# Patient Record
Sex: Female | Born: 1995 | Race: Black or African American | Hispanic: No | Marital: Single | Smoking: Never smoker
Health system: Southern US, Community
[De-identification: ages and names within clinical notes are randomized; demographics above are authoritative.]

## PROBLEM LIST (undated history)

## (undated) ENCOUNTER — Inpatient Hospital Stay: Payer: Self-pay

## (undated) ENCOUNTER — Ambulatory Visit: Admission: EM | Payer: Medicaid Other

## (undated) DIAGNOSIS — O09299 Supervision of pregnancy with other poor reproductive or obstetric history, unspecified trimester: Secondary | ICD-10-CM

## (undated) DIAGNOSIS — Z8632 Personal history of gestational diabetes: Secondary | ICD-10-CM

## (undated) DIAGNOSIS — J45909 Unspecified asthma, uncomplicated: Secondary | ICD-10-CM

## (undated) DIAGNOSIS — J302 Other seasonal allergic rhinitis: Secondary | ICD-10-CM

## (undated) DIAGNOSIS — N62 Hypertrophy of breast: Secondary | ICD-10-CM

## (undated) HISTORY — PX: NO PAST SURGERIES: SHX2092

---
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
## 2013-11-27 DIAGNOSIS — O09299 Supervision of pregnancy with other poor reproductive or obstetric history, unspecified trimester: Secondary | ICD-10-CM

## 2013-11-27 HISTORY — DX: Supervision of pregnancy with other poor reproductive or obstetric history, unspecified trimester: O09.299

## 2014-04-13 ENCOUNTER — Ambulatory Visit: Payer: Self-pay | Admitting: Advanced Practice Midwife

## 2014-04-27 ENCOUNTER — Ambulatory Visit: Payer: Self-pay | Admitting: Advanced Practice Midwife

## 2014-05-26 ENCOUNTER — Inpatient Hospital Stay: Payer: Self-pay

## 2014-05-26 LAB — CBC WITH DIFFERENTIAL/PLATELET
BASOS ABS: 0.1 10*3/uL (ref 0.0–0.1)
BASOS PCT: 0.4 %
Eosinophil #: 0.1 10*3/uL (ref 0.0–0.7)
Eosinophil %: 1.1 %
HCT: 41 % (ref 35.0–47.0)
HGB: 13.3 g/dL (ref 12.0–16.0)
LYMPHS PCT: 21.3 %
Lymphocyte #: 2.9 10*3/uL (ref 1.0–3.6)
MCH: 26.9 pg (ref 26.0–34.0)
MCHC: 32.3 g/dL (ref 32.0–36.0)
MCV: 83 fL (ref 80–100)
Monocyte #: 0.7 x10 3/mm (ref 0.2–0.9)
Monocyte %: 5.1 %
NEUTROS PCT: 72.1 %
Neutrophil #: 9.8 10*3/uL — ABNORMAL HIGH (ref 1.4–6.5)
PLATELETS: 265 10*3/uL (ref 150–440)
RBC: 4.92 10*6/uL (ref 3.80–5.20)
RDW: 15.5 % — ABNORMAL HIGH (ref 11.5–14.5)
WBC: 13.6 10*3/uL — AB (ref 3.6–11.0)

## 2014-05-26 LAB — GC/CHLAMYDIA PROBE AMP

## 2014-05-27 LAB — HEMATOCRIT: HCT: 37.9 % (ref 35.0–47.0)

## 2015-04-06 NOTE — H&P (Signed)
L&D Evaluation:  History Expanded:  HPI 19 yo G1P0 w estimated date of confinement 06/04/14 w contractions and LOF that led to hospital visit, was not initally found to have spontaneous rupture of membranes, but had large gush then at 0315.  No vaginal bleeding.  Prenatal Care at [facility], h/o O neg w Rhogam, TDaP gv, Pos GBS, h/o asthma and tx for GC this pregnancy, and RI/VI.  Plans to breast feed.  Plans OCPs.   Gravida 1   Term 0   Blood Type (Maternal) O negative   Group B Strep Results Maternal (Result >5wks must be treated as unknown) positive   Maternal HIV Negative   Maternal Syphilis Ab Reactive   Maternal Varicella Immune   Rubella Results (Maternal) immune   Maternal T-Dap Immune   [facility]   Presents with leaking fluid   Patient's Medical History Asthma   Patient's Surgical History none   Medications Pre Natal Vitamins   Allergies NKDA   Social History none   Family History Non-Contributory   ROS:  ROS All systems were reviewed.  HEENT, CNS, GI, GU, Respiratory, CV, Renal and Musculoskeletal systems were found to be normal.   Exam:  Vital Signs stable   Urine Protein negative dipstick   General no apparent distress   Mental Status clear   Chest clear   Heart normal sinus rhythm   Abdomen gravid, non-tender, gravid, tender with contractions   Estimated Fetal Weight Average for gestational age   Fetal Position Vtx   Back no CVAT   Edema no edema   Pelvic no external lesions, 3/90/-1 this am   Mebranes Ruptured   Description clear   FHT normal rate with no decels   Ucx regular   Ucx Frequency 4 min   Skin dry   Impression:  Impression active labor, w spontaneous rupture of membranes   Plan:  Plan EFM/NST, monitor contractions and for cervical change, antibiotics for GBBS prophylaxis, fluids   Comments Stadol for pain   Electronic Signatures: Letitia LibraHarris, Robert Paul (MD)  (Signed 30-Jun-15 07:12)  Authored: L&D  Evaluation   Last Updated: 30-Jun-15 07:12 by Letitia LibraHarris, Robert Paul (MD)

## 2015-11-17 ENCOUNTER — Other Ambulatory Visit: Payer: Self-pay | Admitting: Physician Assistant

## 2015-11-17 DIAGNOSIS — O0932 Supervision of pregnancy with insufficient antenatal care, second trimester: Secondary | ICD-10-CM

## 2015-11-17 LAB — OB RESULTS CONSOLE HIV ANTIBODY (ROUTINE TESTING): HIV: NONREACTIVE

## 2015-11-18 ENCOUNTER — Ambulatory Visit
Admission: RE | Admit: 2015-11-18 | Discharge: 2015-11-18 | Disposition: A | Discharge status: Home / Self Care | Payer: BLUE CROSS/BLUE SHIELD | Source: Ambulatory Visit | Attending: Physician Assistant | Admitting: Physician Assistant

## 2015-11-18 DIAGNOSIS — O0932 Supervision of pregnancy with insufficient antenatal care, second trimester: Secondary | ICD-10-CM | POA: Diagnosis present

## 2015-11-18 LAB — OB RESULTS CONSOLE RUBELLA ANTIBODY, IGM: RUBELLA: IMMUNE

## 2015-11-18 LAB — OB RESULTS CONSOLE VARICELLA ZOSTER ANTIBODY, IGG: Varicella: IMMUNE

## 2015-11-18 LAB — OB RESULTS CONSOLE RPR: RPR: NONREACTIVE

## 2015-11-18 LAB — OB RESULTS CONSOLE HEPATITIS B SURFACE ANTIGEN: Hepatitis B Surface Ag: NEGATIVE

## 2015-11-19 ENCOUNTER — Ambulatory Visit: Payer: Self-pay

## 2015-11-28 HISTORY — PX: CHOLECYSTECTOMY: SHX55

## 2015-11-28 NOTE — L&D Delivery Note (Signed)
Delivery Note At 2:33 PM a viable female was delivered via Vaginal, Spontaneous Delivery (Presentation:OA;ROA).  APGAR: 9, 9; weight  .   Placenta status: Intact,Spontaneous.  Cord:  with the following complications: None  Cord pH: NA  Called to see patient.  Mom pushed to delivery viable female infant.  The head with compound right hand followed by shoulders, which delivered without difficulty, and the rest of the body.  No nuchal cord noted.  Baby to mom's chest.  Cord clamped and cut after > 1 min delay.  Cord blood obtained.  Placenta delivered spontaneously, intact, with a 3-vessel cord.  All counts correct.  Hemostasis obtained with IV pitocin and fundal massage. EBL 200 mL.    Anesthesia: Epidural Episiotomy: None Lacerations: Right Labial scrape Suture Repair: NA Est. Blood Loss (mL): 200   Mom to postpartum.  Baby to Couplet care / Skin to Skin.  Tresea MallGLEDHILL,Loeffelholz Coldren, CNM  This patient and plan were discussed with Dr Vergie LivingPickens 03/13/2016

## 2016-01-08 IMAGING — US US OB COMP +14 WK
1 series · 13 of 28 positions shown · non-contrast
Comparison: none

ADDENDUM:
The due date is March 23, 2016, not 1193 as originally reported.
CLINICAL DATA: Limited antenatal care

EXAM:
OBSTETRICAL ULTRASOUND >14 WKS

[Series 1: us ob comp +14 wk · 0.25mm/px · 13 of 79 slices shown]
[im 3/79]
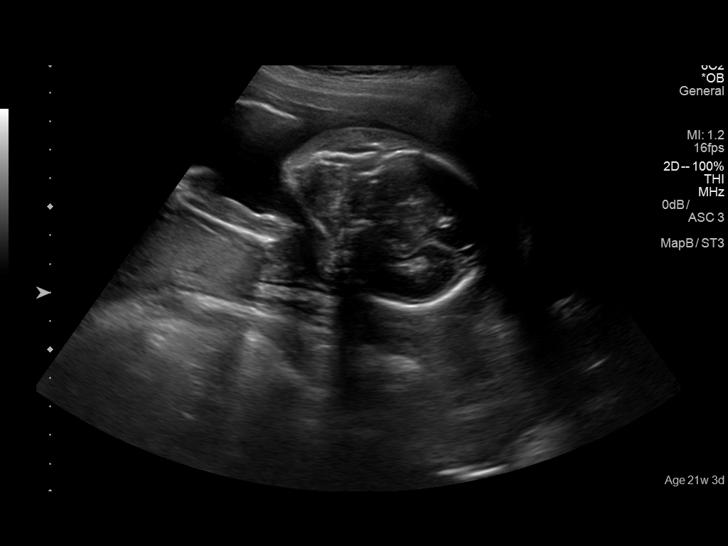
[im 9/79]
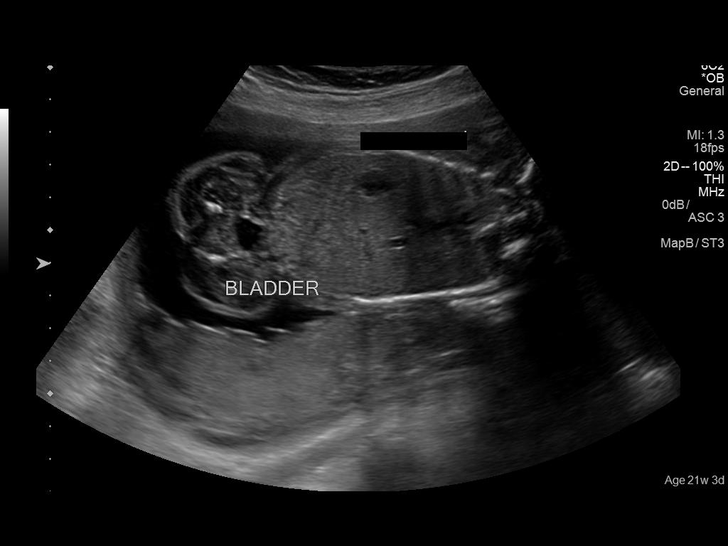
[im 15/79]
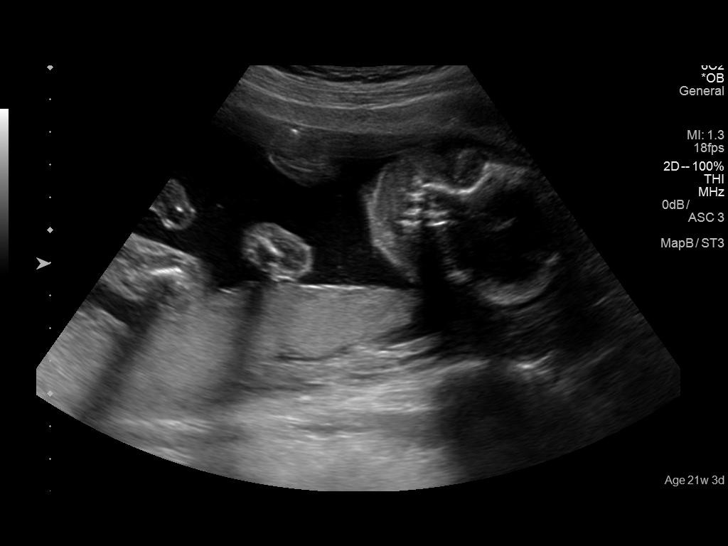
[im 21/79]
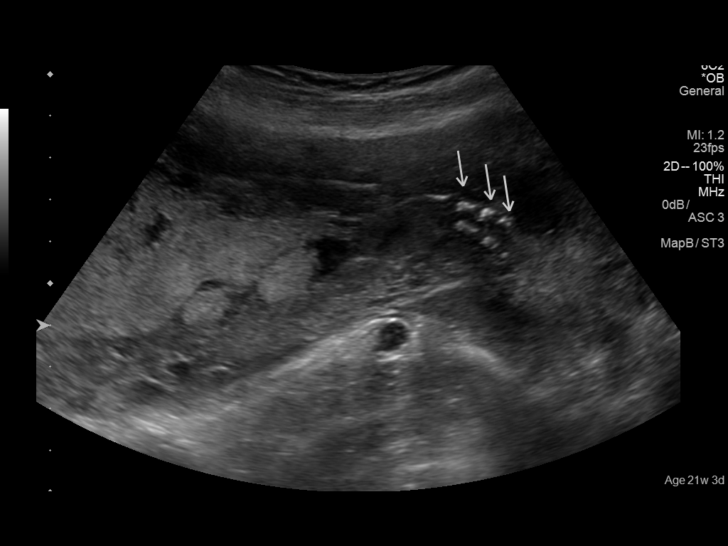
[im 27/79]
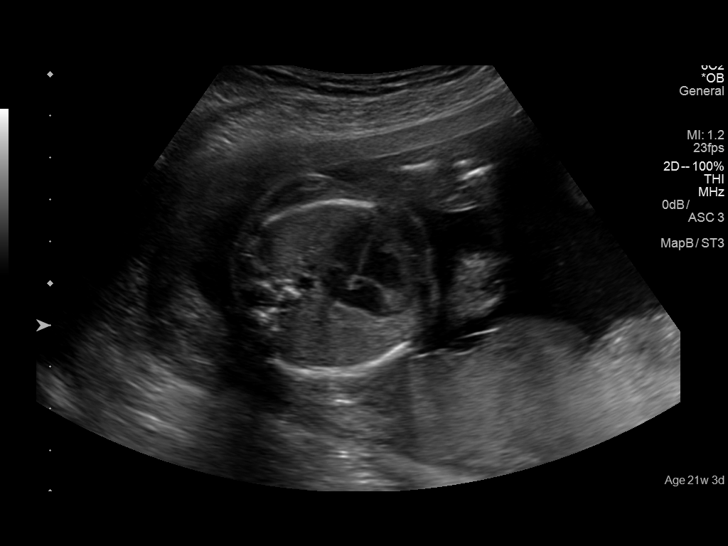
[im 32/79]
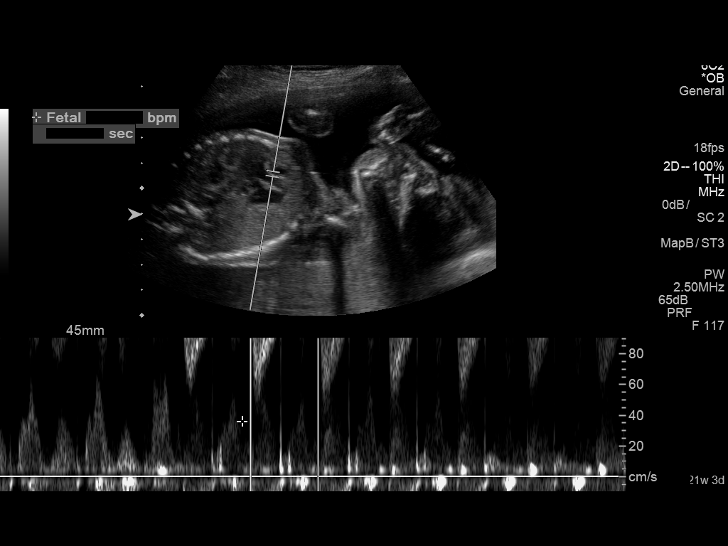
[im 41/79]
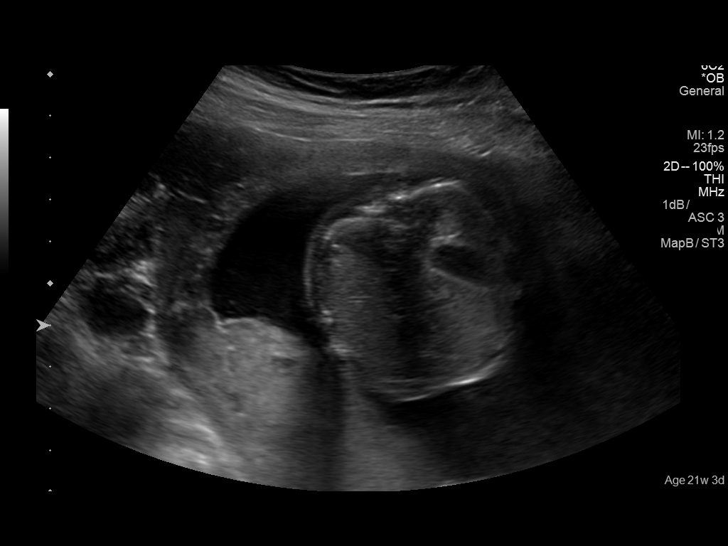
[im 47/79]
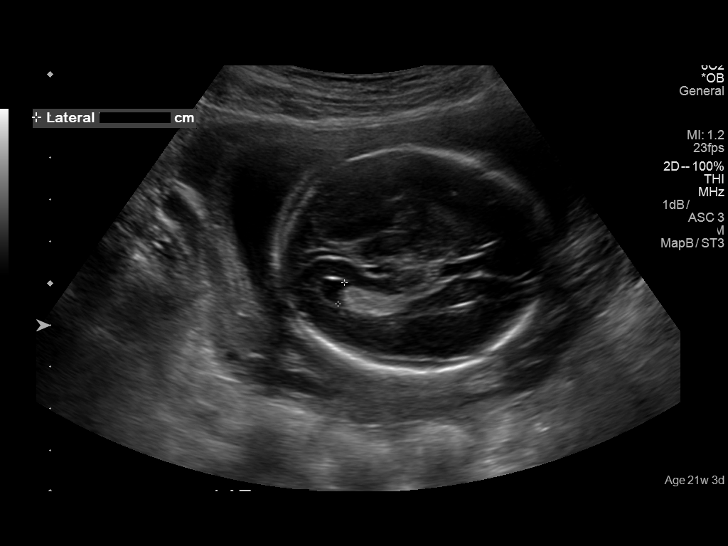
[im 53/79]
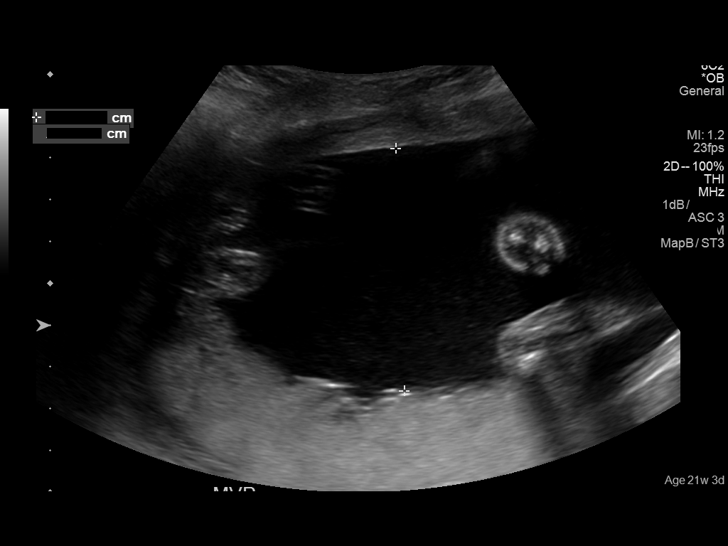
[im 58/79]
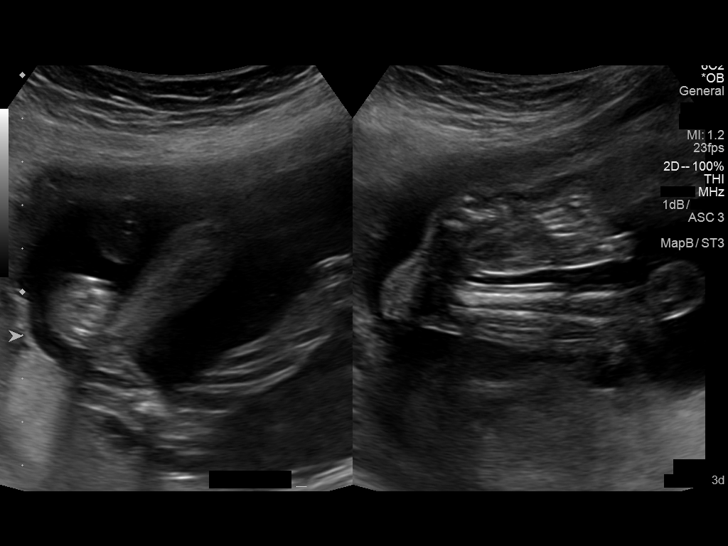
[im 64/79]
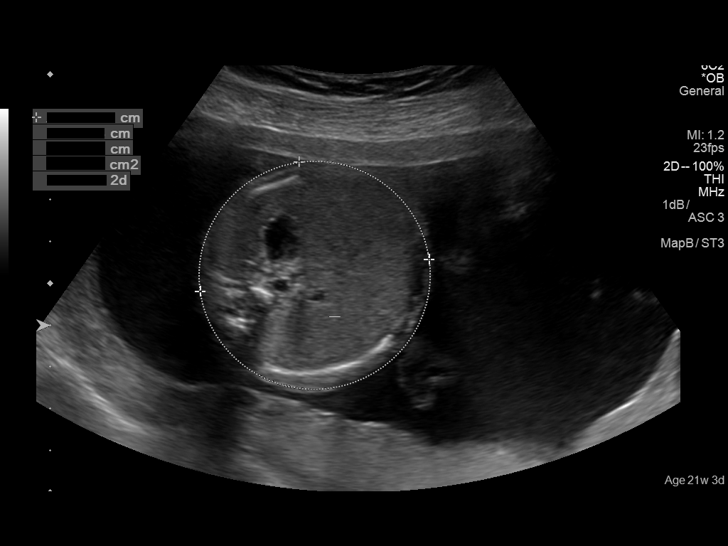
[im 70/79]
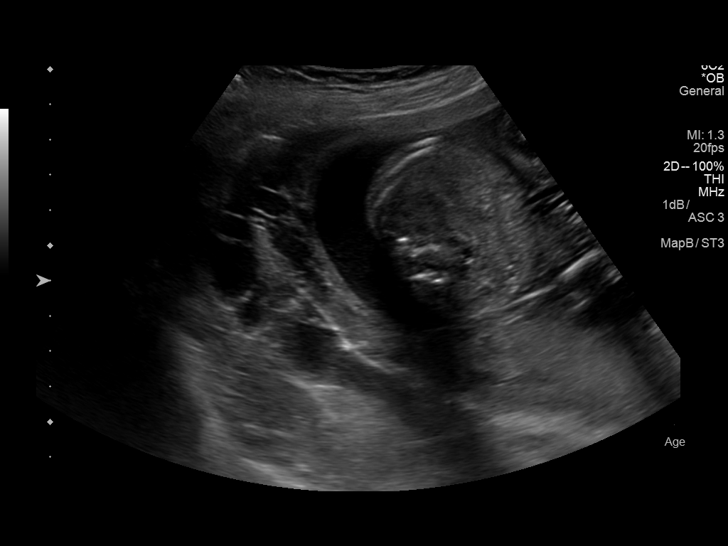
[im 76/79]
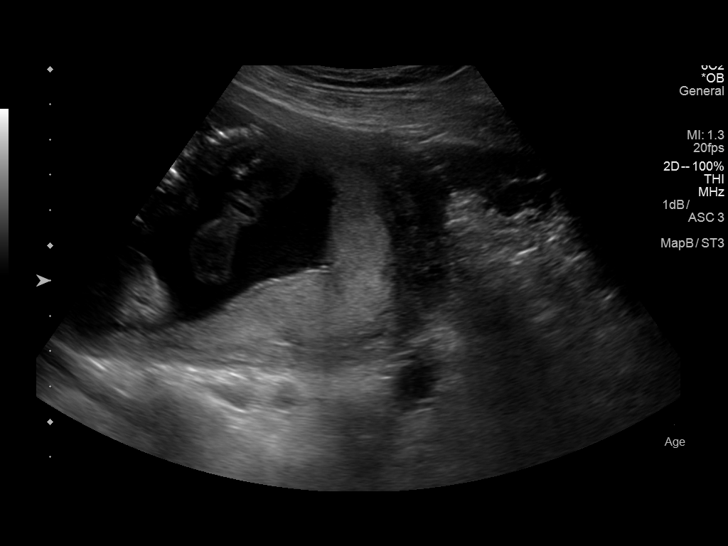

[13 of 28 positions shown; findings below may reference images not displayed]

FINDINGS: Number of Fetuses: 1

Heart Rate:  152 bpm

Movement: Present

Presentation: Cephalic

Previa: None ; the distance from the lower placental segment to the
internal cervical os is 7.6 cm

Placental Location: Posterior

Amniotic Fluid (Subjective): Normal

Vertical pocket 5.8 cmcm

FETAL BIOMETRY

BPD:  5.25cm 22w 0d

HC:    19.65cm  21w   6d

AC:   17.37cm  22w   2d

FL:   3.74cm  21w   6d

Current Mean GA: 22w 0d              US EDC: March 24, 2015

FETAL ANATOMY

Lateral Ventricles: Visualized

Thalami/CSP: The visualized

Posterior Fossa:  Visualized

Nuchal Region: Visualized    NFT= 4.5mm

Upper Lip: Visualized

Spine: Visualized

4 Chamber Heart on Left: Visualized

LVOT: Visualized

RVOT: Visualized

Stomach on Left: Visualized

3 Vessel Cord: Visualized

Cord Insertion site: Visualized

Kidneys: Visualize

Bladder: Visualize

Extremities: Visualized

Sex: Male genitalia

Maternal Findings:

Cervix: 4.6 cm with closed internal cervical os. The adnexal
structures are unremarkable.
IMPRESSION: There is a single viable IUP with estimated gestational age of 22
weeks 0 days with estimated date of confinement March 24, 2015.
This is approximately 4 days more mature than that predicted on
clinical grounds. The presentation is cephalic. The placenta is
posterior with no evidence of previa. The amniotic fluid volume is
estimated to be normal. No fetal anomalies are demonstrated.

## 2016-02-18 ENCOUNTER — Ambulatory Visit
Admission: RE | Admit: 2016-02-18 | Discharge: 2016-02-18 | Disposition: A | Discharge status: Home / Self Care | Payer: BLUE CROSS/BLUE SHIELD | Source: Ambulatory Visit | Attending: Family Medicine | Admitting: Family Medicine

## 2016-02-18 DIAGNOSIS — R000 Tachycardia, unspecified: Secondary | ICD-10-CM | POA: Diagnosis present

## 2016-02-24 ENCOUNTER — Ambulatory Visit
Admission: RE | Admit: 2016-02-24 | Discharge: 2016-02-24 | Disposition: A | Discharge status: Home / Self Care | Payer: BLUE CROSS/BLUE SHIELD | Source: Ambulatory Visit | Attending: Obstetrics and Gynecology | Admitting: Obstetrics and Gynecology

## 2016-02-24 VITALS — BP 124/74 | HR 90 | Temp 98.8°F | Resp 18 | Wt 169.4 lb

## 2016-02-24 DIAGNOSIS — O1213 Gestational proteinuria, third trimester: Secondary | ICD-10-CM

## 2016-02-24 DIAGNOSIS — O368390 Maternal care for abnormalities of the fetal heart rate or rhythm, unspecified trimester, not applicable or unspecified: Secondary | ICD-10-CM | POA: Insufficient documentation

## 2016-02-24 DIAGNOSIS — J45909 Unspecified asthma, uncomplicated: Secondary | ICD-10-CM | POA: Insufficient documentation

## 2016-02-24 HISTORY — DX: Supervision of pregnancy with other poor reproductive or obstetric history, unspecified trimester: O09.299

## 2016-02-24 HISTORY — DX: Personal history of gestational diabetes: Z86.32

## 2016-02-24 NOTE — Progress Notes (Signed)
Addendum: 24hr urine result on 02/21/15 was 147.9 mg/24hr (wnls)

## 2016-02-24 NOTE — Progress Notes (Signed)
[facility] Consultation   Chief Complaint: Proteinuria on urine dip and an episode of tachycardia during pregnancy.  HPI: Ms. Erica Glenn is a 20 y.o. G2P1001 at [redacted]w[redacted]d by L=22 week [place] (performed at [facility]) who presents in consultation from [facility] for recommendations regarding persistent 1+ proteinuria on urine dip in the absence of other symptoms (s/p treatment of a UTI this pregnancy) and an episode of tachycardia that the patient states has resolved and was just a one time event.  Past Medical History: Patient  has a h/o gestational diabetes in prior pregnancy, currently pregnant (2015). History of asthma (not since she was 20 yo). Past Surgical History: She  has no past surgical history on file.  Obstetric History:  OB History    Gravida Para Term Preterm AB TAB SAB Ectopic Multiple Living   2015 NVD at [redacted]w[redacted]d of female infant weighting 7#2oz; pregnancy complicated by diet controlled GDM  Gynecologic History:  Patient's last menstrual period was 06/21/2015.   Hx of abnormal pap smears: no   Medications: PNVs, Albuterol PRN Allergies: Patient has No Known Allergies.  Social History: Patient  reports that she has never smoked. She has never used smokeless tobacco. She reports that she does not drink alcohol or use illicit drugs. She is in a relationship with the FOB who is also the father of her daughter.  She lives with her daughter and her mother and is a full time Theatre stage manager. Family History: Father with cirrhosis and HTN, PGM with HTN, PGF with CHF (deceased), MGM with DM and breast cancer Review of Systems A full 12 point review of systems was negative or as noted in the History of Present Illness.  Physical Exam: BP 124/74 mmHg  Pulse 90  Temp(Src) 98.8 F (37.1 C) (Oral)  Resp 18  Wt 169 lb 6.4 oz (76.839 kg)  SpO2 100%  LMP 06/21/2015   Asessement: 1. Proteinuria affecting pregnancy in third trimester   2. Antepartum fetal  tachycardia affecting care of mother     Plan: 20yo G2P1001 at [redacted]w[redacted]d with 1+ proteinuria on urine dip and one episode (per patient) of tachycardia. 1.  Proteinuria.  Urinary dipstick has a high false-positive and false-negative rate when used to screen for abnormal proteinuria in pregnancy, especially at the 1+ level Erica Glenn et al., Obstetrics and Gynecology 2004). This is due to variability in urine concentration (osmolality), which can affect random urine protein concentration (ie, the dipstick result).  "False positive tests may occur in the presence of gross (macroscopic) blood in the urine, semen, very alkaline urine (pH >7), quaternary ammonium compounds, detergents and disinfectants, drugs, radio-contrast agents, and high specific gravity (>1.030). Positive tests for protein due to blood in the urine seldom exceed 1+ by dipstick. False negatives may occur with low specific gravity (<1.010), high salt concentration, highly acidic urine, or with nonalbumin proteinuria."(UpToDate Proteinuria in pregnancy).  P/C ratio was 150 which is normal; 24hr urine was reportedly collected but results are not available.  In the absence of a UTI (test of cure was reported to be negative after she was treated), hematuria, vaginal infections or elevated blood pressures and assuming the 24hr urine result is normal, 1+ proteinuria should not be further evaluated and is unlikely to be clinically significant.  If further evaluation is desired, a urinalysis to look for blood and casts should be ordered (this may have been done but records are not available).  Alternatively, a catheterized specimen may be collected but this is an invasive and uncomfortable procedure.  Suspect that the 1+ protein may, in part, be due to the difficulty in collecting a truly clean catch specimen during pregnancy.  2.  Tachycardia.  Ms. Erica Glenn denies episodes of palpitations, shortness of breath or chest pain.  She had an EKG but this has not  been interpreted yet and I don't have access to the actual read.  She reportedly had TFTs done which demonstrated a normal TSH but a low free T4 (? The actual result).  These results are not available for review.  In addition, anemia can be associated with tachycardia (although it would be persistent).  Cbc can be drawn to evaluate for anemia.  We would be more than happy to see Ms. Erica Glenn back for follow up of any of these pending labs or to review the results.  Given her lack of symptoms, I suspect that no further evaluation or intervention is indicated.  If proteinuria persists postpartum, urinalysis and referral to nephrology can be considered.   Total time spent with the patient was 30 minutes with greater than 50% spent in counseling and coordination of care. We appreciate this interesting consult and will be happy to be involved in the ongoing care of Ms. Erica Glenn in anyway her obstetricians desire.  Erica FunkSarah Anissa Abbs, MD Maternal-Fetal Medicine [facility]

## 2016-02-28 LAB — OB RESULTS CONSOLE GC/CHLAMYDIA
Chlamydia: NEGATIVE
Gonorrhea: NEGATIVE

## 2016-03-11 ENCOUNTER — Observation Stay
Admission: EM | Admit: 2016-03-11 | Discharge: 2016-03-11 | Disposition: A | Discharge status: Home / Self Care | Payer: BLUE CROSS/BLUE SHIELD | Attending: Obstetrics and Gynecology | Admitting: Obstetrics and Gynecology

## 2016-03-11 DIAGNOSIS — O1213 Gestational proteinuria, third trimester: Secondary | ICD-10-CM

## 2016-03-11 DIAGNOSIS — Z3A38 38 weeks gestation of pregnancy: Secondary | ICD-10-CM | POA: Insufficient documentation

## 2016-03-11 DIAGNOSIS — O368390 Maternal care for abnormalities of the fetal heart rate or rhythm, unspecified trimester, not applicable or unspecified: Secondary | ICD-10-CM

## 2016-03-11 DIAGNOSIS — O471 False labor at or after 37 completed weeks of gestation: Principal | ICD-10-CM | POA: Insufficient documentation

## 2016-03-11 HISTORY — DX: Unspecified asthma, uncomplicated: J45.909

## 2016-03-11 MED ORDER — MISOPROSTOL 200 MCG PO TABS
ORAL_TABLET | ORAL | Status: AC
  Filled 2016-03-11: qty 4

## 2016-03-11 MED ORDER — LIDOCAINE HCL (PF) 1 % IJ SOLN
INTRAMUSCULAR | Status: AC
  Filled 2016-03-11: qty 30

## 2016-03-11 MED ORDER — OXYTOCIN 40 UNITS IN LACTATED RINGERS INFUSION - SIMPLE MED
INTRAVENOUS | Status: AC
  Filled 2016-03-11: qty 1000

## 2016-03-11 MED ORDER — OXYTOCIN 10 UNIT/ML IJ SOLN
INTRAMUSCULAR | Status: AC
  Filled 2016-03-11: qty 2

## 2016-03-11 MED ORDER — AMMONIA AROMATIC IN INHA
RESPIRATORY_TRACT | Status: AC
  Filled 2016-03-11: qty 10

## 2016-03-11 NOTE — Discharge Instructions (Signed)
Discharge instructions reviewed with patient, patient sign copy and copy given.  Labor precautions discussed with patient, patient verbalized understanding.

## 2016-03-11 NOTE — OB Triage Note (Signed)
Patient came in with complaint of contractions that started this afternoon around 4:00. Patient rates contractions 7 out of 10 that she feels in her lower abdomen. Patient denies vaginal bleeding, but states she felt some leaking but not a gush of fluid. Patient has been feeling baby move just fine. FHR baseline 140 with moderate variability and 15 x 15 accelerations and no decelerations. Family at bedside. Will continue to monitor.

## 2016-03-11 NOTE — Discharge Summary (Signed)
Patient presented for evaluation of labor.  Patient had cervical exam by RN and this was reported to me. I reviewed her vital signs and fetal tracing, both of which were reassuring.  Patient was discharge as she was not laboring.  Thomasene MohairStephen Neiva Maenza, MD 03/11/2016 8:15 AM

## 2016-03-13 ENCOUNTER — Inpatient Hospital Stay: Payer: BLUE CROSS/BLUE SHIELD | Admitting: Anesthesiology

## 2016-03-13 ENCOUNTER — Inpatient Hospital Stay
Admission: EM | Admit: 2016-03-13 | Discharge: 2016-03-15 | DRG: 775 | Disposition: A | Discharge status: Home / Self Care | Payer: BLUE CROSS/BLUE SHIELD | Attending: Obstetrics and Gynecology | Admitting: Obstetrics and Gynecology

## 2016-03-13 DIAGNOSIS — O0933 Supervision of pregnancy with insufficient antenatal care, third trimester: Secondary | ICD-10-CM

## 2016-03-13 DIAGNOSIS — O9952 Diseases of the respiratory system complicating childbirth: Secondary | ICD-10-CM | POA: Diagnosis present

## 2016-03-13 DIAGNOSIS — Z79899 Other long term (current) drug therapy: Secondary | ICD-10-CM

## 2016-03-13 DIAGNOSIS — O1213 Gestational proteinuria, third trimester: Secondary | ICD-10-CM

## 2016-03-13 DIAGNOSIS — Z8632 Personal history of gestational diabetes: Secondary | ICD-10-CM

## 2016-03-13 DIAGNOSIS — O99824 Streptococcus B carrier state complicating childbirth: Secondary | ICD-10-CM | POA: Diagnosis present

## 2016-03-13 DIAGNOSIS — Z3A38 38 weeks gestation of pregnancy: Secondary | ICD-10-CM | POA: Diagnosis not present

## 2016-03-13 DIAGNOSIS — J45909 Unspecified asthma, uncomplicated: Secondary | ICD-10-CM | POA: Diagnosis present

## 2016-03-13 DIAGNOSIS — O1214 Gestational proteinuria, complicating childbirth: Secondary | ICD-10-CM | POA: Diagnosis present

## 2016-03-13 DIAGNOSIS — O368390 Maternal care for abnormalities of the fetal heart rate or rhythm, unspecified trimester, not applicable or unspecified: Secondary | ICD-10-CM

## 2016-03-13 LAB — ABO/RH: ABO/RH(D): O NEG

## 2016-03-13 LAB — TYPE AND SCREEN
ABO/RH(D): O NEG
Antibody Screen: NEGATIVE

## 2016-03-13 LAB — CBC
HCT: 36.1 % (ref 35.0–47.0)
Hemoglobin: 11.8 g/dL — ABNORMAL LOW (ref 12.0–16.0)
MCH: 25.4 pg — AB (ref 26.0–34.0)
MCHC: 32.8 g/dL (ref 32.0–36.0)
MCV: 77.4 fL — ABNORMAL LOW (ref 80.0–100.0)
PLATELETS: 261 10*3/uL (ref 150–440)
RBC: 4.66 MIL/uL (ref 3.80–5.20)
RDW: 16.7 % — ABNORMAL HIGH (ref 11.5–14.5)
WBC: 12.6 10*3/uL — AB (ref 3.6–11.0)

## 2016-03-13 LAB — CHLAMYDIA/NGC RT PCR (ARMC ONLY)
Chlamydia Tr: NOT DETECTED
N gonorrhoeae: NOT DETECTED

## 2016-03-13 MED ORDER — ONDANSETRON HCL 4 MG PO TABS: 4.0000 mg | ORAL_TABLET | ORAL | Status: DC | PRN

## 2016-03-13 MED ORDER — NALBUPHINE HCL 10 MG/ML IJ SOLN: 5.0000 mg | INTRAMUSCULAR | Status: DC | PRN

## 2016-03-13 MED ORDER — KETOROLAC TROMETHAMINE 30 MG/ML IJ SOLN: 30.0000 mg | Freq: Four times a day (QID) | INTRAMUSCULAR | Status: DC | PRN

## 2016-03-13 MED ORDER — OXYTOCIN 40 UNITS IN LACTATED RINGERS INFUSION - SIMPLE MED: 1.0000 m[IU]/min | INTRAVENOUS | Status: DC

## 2016-03-13 MED ORDER — EPHEDRINE 5 MG/ML INJ
5.0000 mg | INTRAVENOUS | Status: DC | PRN
  Administered 2016-03-13 (×2): 5 mg via INTRAVENOUS
  Filled 2016-03-13: qty 1

## 2016-03-13 MED ORDER — LACTATED RINGERS IV SOLN
500.0000 mL | INTRAVENOUS | Status: DC | PRN
  Administered 2016-03-13 (×2): 500 mL via INTRAVENOUS

## 2016-03-13 MED ORDER — EPHEDRINE 5 MG/ML INJ
10.0000 mg | Freq: Once | INTRAVENOUS | Status: AC
  Administered 2016-03-13: 10 mg via INTRAVENOUS

## 2016-03-13 MED ORDER — FENTANYL 2.5 MCG/ML W/ROPIVACAINE 0.2% IN NS 100 ML EPIDURAL INFUSION (ARMC-ANES): 10.0000 mL/h | EPIDURAL | Status: DC

## 2016-03-13 MED ORDER — DIBUCAINE 1 % RE OINT: 1.0000 "application " | TOPICAL_OINTMENT | RECTAL | Status: DC | PRN

## 2016-03-13 MED ORDER — ONDANSETRON HCL 4 MG/2ML IJ SOLN: 4.0000 mg | Freq: Three times a day (TID) | INTRAMUSCULAR | Status: DC | PRN

## 2016-03-13 MED ORDER — NALBUPHINE HCL 10 MG/ML IJ SOLN: 5.0000 mg | Freq: Once | INTRAMUSCULAR | Status: DC | PRN

## 2016-03-13 MED ORDER — SODIUM CHLORIDE 0.9 % IV SOLN
INTRAVENOUS | Status: AC
  Administered 2016-03-13: 2 g via INTRAVENOUS
  Filled 2016-03-13: qty 2000

## 2016-03-13 MED ORDER — LACTATED RINGERS IV SOLN
INTRAVENOUS | Status: DC
  Administered 2016-03-13: 04:00:00 via INTRAVENOUS

## 2016-03-13 MED ORDER — TETANUS-DIPHTH-ACELL PERTUSSIS 5-2.5-18.5 LF-MCG/0.5 IM SUSP: 0.5000 mL | Freq: Once | INTRAMUSCULAR | Status: DC

## 2016-03-13 MED ORDER — OXYTOCIN 40 UNITS IN LACTATED RINGERS INFUSION - SIMPLE MED
1.0000 m[IU]/min | INTRAVENOUS | Status: DC
Start: 2016-03-13 — End: 2016-03-15
  Administered 2016-03-13: 1 m[IU]/min via INTRAVENOUS

## 2016-03-13 MED ORDER — OXYTOCIN 40 UNITS IN LACTATED RINGERS INFUSION - SIMPLE MED: 2.5000 [IU]/h | INTRAVENOUS | Status: DC

## 2016-03-13 MED ORDER — IBUPROFEN 600 MG PO TABS
600.0000 mg | ORAL_TABLET | Freq: Four times a day (QID) | ORAL | Status: DC
  Administered 2016-03-13 – 2016-03-15 (×9): 600 mg via ORAL
  Filled 2016-03-13 (×9): qty 1

## 2016-03-13 MED ORDER — ACETAMINOPHEN 325 MG PO TABS: 650.0000 mg | ORAL_TABLET | ORAL | Status: DC | PRN

## 2016-03-13 MED ORDER — COCONUT OIL OIL
1.0000 "application " | TOPICAL_OIL | Status: DC | PRN
  Administered 2016-03-15: 1 via TOPICAL
  Filled 2016-03-13: qty 120

## 2016-03-13 MED ORDER — MEPERIDINE HCL 25 MG/ML IJ SOLN: 6.2500 mg | INTRAMUSCULAR | Status: DC | PRN

## 2016-03-13 MED ORDER — DIPHENHYDRAMINE HCL 50 MG/ML IJ SOLN: 12.5000 mg | INTRAMUSCULAR | Status: DC | PRN

## 2016-03-13 MED ORDER — ONDANSETRON HCL 4 MG/2ML IJ SOLN: 4.0000 mg | INTRAMUSCULAR | Status: DC | PRN

## 2016-03-13 MED ORDER — OXYTOCIN BOLUS FROM INFUSION: 500.0000 mL | INTRAVENOUS | Status: DC

## 2016-03-13 MED ORDER — SODIUM CHLORIDE 0.9% FLUSH: 3.0000 mL | INTRAVENOUS | Status: DC | PRN

## 2016-03-13 MED ORDER — DIPHENHYDRAMINE HCL 25 MG PO CAPS: 25.0000 mg | ORAL_CAPSULE | ORAL | Status: DC | PRN

## 2016-03-13 MED ORDER — SODIUM CHLORIDE FLUSH 0.9 % IV SOLN
INTRAVENOUS | Status: AC
  Filled 2016-03-13: qty 10

## 2016-03-13 MED ORDER — EPHEDRINE SULFATE 50 MG/ML IJ SOLN
INTRAMUSCULAR | Status: AC
  Filled 2016-03-13: qty 1

## 2016-03-13 MED ORDER — BENZOCAINE-MENTHOL 20-0.5 % EX AERO: 1.0000 "application " | INHALATION_SPRAY | CUTANEOUS | Status: DC | PRN

## 2016-03-13 MED ORDER — BUTORPHANOL TARTRATE 1 MG/ML IJ SOLN
2.0000 mg | INTRAMUSCULAR | Status: DC | PRN
  Administered 2016-03-13: 2 mg via INTRAVENOUS
  Filled 2016-03-13: qty 2

## 2016-03-13 MED ORDER — DIPHENHYDRAMINE HCL 25 MG PO CAPS
25.0000 mg | ORAL_CAPSULE | Freq: Four times a day (QID) | ORAL | Status: DC | PRN
Start: 2016-03-13 — End: 2016-03-15

## 2016-03-13 MED ORDER — ONDANSETRON HCL 4 MG/2ML IJ SOLN
4.0000 mg | Freq: Four times a day (QID) | INTRAMUSCULAR | Status: DC | PRN
  Administered 2016-03-13: 4 mg via INTRAVENOUS
  Filled 2016-03-13: qty 2

## 2016-03-13 MED ORDER — EPHEDRINE 5 MG/ML INJ
5.0000 mg | Freq: Once | INTRAVENOUS | Status: AC
  Administered 2016-03-13: 5 mg via INTRAVENOUS

## 2016-03-13 MED ORDER — OXYCODONE HCL 5 MG PO TABS
10.0000 mg | ORAL_TABLET | ORAL | Status: DC | PRN
Start: 2016-03-13 — End: 2016-03-15

## 2016-03-13 MED ORDER — SIMETHICONE 80 MG PO CHEW: 80.0000 mg | CHEWABLE_TABLET | ORAL | Status: DC | PRN

## 2016-03-13 MED ORDER — TERBUTALINE SULFATE 1 MG/ML IJ SOLN: 0.2500 mg | Freq: Once | INTRAMUSCULAR | Status: DC | PRN

## 2016-03-13 MED ORDER — LIDOCAINE HCL (PF) 1 % IJ SOLN: 30.0000 mL | INTRAMUSCULAR | Status: DC | PRN

## 2016-03-13 MED ORDER — WITCH HAZEL-GLYCERIN EX PADS: 1.0000 "application " | MEDICATED_PAD | CUTANEOUS | Status: DC | PRN

## 2016-03-13 MED ORDER — SODIUM CHLORIDE 0.9 % IV SOLN
INTRAVENOUS | Status: DC | PRN
  Administered 2016-03-13 (×2): 4 mL via EPIDURAL

## 2016-03-13 MED ORDER — OXYTOCIN 40 UNITS IN LACTATED RINGERS INFUSION - SIMPLE MED
INTRAVENOUS | Status: AC
  Filled 2016-03-13: qty 1000

## 2016-03-13 MED ORDER — LIDOCAINE-EPINEPHRINE (PF) 1.5 %-1:200000 IJ SOLN
INTRAMUSCULAR | Status: DC | PRN
  Administered 2016-03-13: 3 mL via EPIDURAL

## 2016-03-13 MED ORDER — NALOXONE HCL 2 MG/2ML IJ SOSY
1.0000 ug/kg/h | PREFILLED_SYRINGE | INTRAVENOUS | Status: DC | PRN
  Filled 2016-03-13: qty 2

## 2016-03-13 MED ORDER — FENTANYL 2.5 MCG/ML W/ROPIVACAINE 0.2% IN NS 100 ML EPIDURAL INFUSION (ARMC-ANES)
EPIDURAL | Status: AC
  Administered 2016-03-13: 10 mL/h via EPIDURAL
  Filled 2016-03-13: qty 100

## 2016-03-13 MED ORDER — SODIUM CHLORIDE 0.9 % IV SOLN
2.0000 g | Freq: Once | INTRAVENOUS | Status: AC
  Administered 2016-03-13: 2 g via INTRAVENOUS

## 2016-03-13 MED ORDER — SENNOSIDES-DOCUSATE SODIUM 8.6-50 MG PO TABS
2.0000 | ORAL_TABLET | ORAL | Status: DC
  Administered 2016-03-13 – 2016-03-15 (×2): 2 via ORAL
  Filled 2016-03-13 (×2): qty 2

## 2016-03-13 MED ORDER — OXYCODONE HCL 5 MG PO TABS
5.0000 mg | ORAL_TABLET | ORAL | Status: DC | PRN
  Administered 2016-03-13 – 2016-03-14 (×3): 5 mg via ORAL
  Filled 2016-03-13 (×3): qty 1

## 2016-03-13 MED ORDER — PRENATAL MULTIVITAMIN CH
1.0000 | ORAL_TABLET | Freq: Every day | ORAL | Status: DC
  Administered 2016-03-14 – 2016-03-15 (×2): 1 via ORAL
  Filled 2016-03-13 (×2): qty 1

## 2016-03-13 MED ORDER — NALOXONE HCL 0.4 MG/ML IJ SOLN: 0.4000 mg | INTRAMUSCULAR | Status: DC | PRN

## 2016-03-13 NOTE — H&P (Signed)
OB History & Physical   History of Present Illness:  Chief Complaint: IUP at 38 weeks, contractions, "water broke"  HPI:  Erica Glenn is a 20 y.o. G2P1001 female at 6380w0d dated by U/S.  Her pregnancy has been complicated by UTI, insufficient prenatal care, Rh negative, asthma.    She reports contractions.   She reports leakage of fluid.   She denies vaginal bleeding.   She reports fetal movement.    Maternal Medical History:   Past Medical History  Diagnosis Date  . H/O gestational diabetes in prior pregnancy, currently pregnant 2015    Scolosis  . Asthma     Past Surgical History  Procedure Laterality Date  . No past surgeries      No Known Allergies  Prior to Admission medications   Medication Sig Start Date End Date Taking? Authorizing Provider  Prenatal Vit-Fe Fumarate-FA (PRENATAL MULTIVITAMIN) TABS tablet Take 1 tablet by mouth daily at 12 noon.   Yes Historical Provider, MD    OB History  Gravida Para Term Preterm AB SAB TAB Ectopic Multiple Living  2 1 1       1     # Outcome Date GA Lbr Len/2nd Weight Sex Delivery Anes PTL Lv  2 Current           1 Term 05/26/14 652w5d  3.232 kg (7 lb 2 oz) F    Y      Prenatal care site: [facility]  Social History: She  reports that she has never smoked. She has never used smokeless tobacco. She reports that she does not drink alcohol or use illicit drugs.  Family History: family history is not on file.   Review of Systems: Negative x 10 systems reviewed except as noted in the HPI.    Physical Exam:  Vital Signs: BP 107/44 mmHg  Pulse 121  Temp(Src) 97.7 F (36.5 C) (Axillary)  Resp 20  Ht 5' (1.524 m)  Wt 78.019 kg (172 lb)  BMI 33.59 kg/m2  LMP 06/21/2015 General: no acute distress.  HEENT: normocephalic, atraumatic Heart: regular rate & rhythm.  No murmurs/rubs/gallops Lungs: clear to auscultation bilaterally Abdomen: soft, gravid, non-tender;  EFW: 6 1/2 to 7 pounds Pelvic:   External: Normal external  female genitalia  Cervix: Dilation: 8 / Effacement (%): 100 / Station: 0   ROM: - nitrazine Extremities: non-tender, symmetric, no edema bilaterally.  DTRs: 2+ bilaterally  Neurologic: Alert & oriented x 3.    Pertinent Results:  Prenatal Labs: Blood type/Rh O negative  Antibody screen negative  Rubella Unknown  Varicella Unknown    RPR Non Reactive  HBsAg negative  HIV negative  GC negative  Chlamydia negative  Genetic screening Not done  1 hour GTT 133  3 hour GTT NA  GBS unknown    Baseline FHR: 135 beats/min   Variability: moderate   Accelerations: present   Decelerations: absent Contractions: present frequency: 2-4 Overall assessment: Category I   Assessment:  Erica Glenn is a 20 y.o. 262P1001 female at 7980w0d with active labor.   Plan:  1. Admit to Labor & Delivery  2. CBC, T&S, Clrs, IVF 3. GBS unknown without risk factors at term: no treatment per 2010 CDC guidelines.   4. Fetal well-being: Category I 5. Epidural for pain relief as desired   Crimson Dubberly, CNM  This patient and plan were discussed with Dr Vergie LivingPickens 03/13/2016

## 2016-03-13 NOTE — Progress Notes (Signed)
Pt was seen by me at 0914 and AROM for clear fluid at that time. Decision made per prenatal records of unknown GBS status without risk factors to not treat in labor. Updated records received showing GBS bacteriuria in early pregnancy with the need to treat in labor. Order put in for ampicillin 2gram loading dose and initiated GBS prophylaxis at 1105. Also consult with Dr Vergie LivingPickens who discovered + GBS in previous pregnancy and based on the unknown status with this pregnancy and the insufficient prenatal care, recommendation to treat in labor.  Erica Glenn,Erica Glenn, CNM   This patient and plan were discussed with Dr Vergie LivingPickens 03/13/2016

## 2016-03-13 NOTE — Discharge Summary (Signed)
OB Discharge Summary  Patient Name: Erica Glenn DOB: 03-04-1996 MRN: [id number]  Date of admission: 03/13/2016 Delivering MD: Tresea Mall, CNM Date of Delivery: 03/15/2016  Date of discharge:03/15/2016  Admitting diagnosis: 38 Weeks Contractions  Intrauterine pregnancy: [redacted]w[redacted]d     Secondary diagnosis: asthma, insufficient prenatal care, persistent proteinuria- had consult with [facility], GBS carrier, UTI, Pica, Rh negative     Discharge diagnosis: Term Pregnancy Delivered                                                                                                Post partum procedures:rhogam  Augmentation: AROM and Pitocin  Complications: None  Hospital course:  Onset of Labor With Vaginal Delivery     20 y.o. yo [id number] at [redacted]w[redacted]d was admitted in Active Labor on 03/13/2016. Patient had an uncomplicated labor course as follows:  Membrane Rupture Time/Date: 9:18 AM ,03/13/2016   Intrapartum Procedures: Episiotomy:   None                                        Lacerations:   Right labial scrape Patient had a delivery of a Viable infant. 03/13/2016  Information for the patient's newborn:  Erica, Glenn [[id number]]  Delivery Method: Vaginal, Spontaneous Delivery (Filed from Delivery Summary)     Pateint had an uncomplicated postpartum course.  She is ambulating, tolerating a regular diet, passing flatus, and urinating well. Patient is discharged home in stable condition on 03/15/2016.    Physical exam  Filed Vitals:   03/14/16 1700 03/14/16 2043 03/15/16 0030 03/15/16 0758  BP:  118/71  102/64  Pulse:  93  99  Temp: 98.5 F (36.9 C) 98.2 F (36.8 C) 98 F (36.7 C) 97.4 F (36.3 C)  TempSrc: Oral Oral Oral Oral  Resp:  18  18  Height:      Weight:      SpO2:  99%  100%   General: alert, cooperative and no distress Lochia: appropriate Uterine Fundus: firm at U-2/ML/NT Incision: N/A DVT Evaluation: No evidence of DVT seen on physical exam.  Labs: Lab  Results  Component Value Date   WBC 15.4* 03/14/2016   HGB 11.3* 03/14/2016   HCT 34.4* 03/14/2016   MCV 78.7* 03/14/2016   PLT 246 03/14/2016  O neg/ RI/ VI/ TDAP UTD   Discharge instruction: per After Visit Summary.  Medications:    Medication List    TAKE these medications        ibuprofen 600 MG tablet  Commonly known as:  ADVIL,MOTRIN  Take 1 tablet (600 mg total) by mouth every 6 (six) hours.     oxyCODONE-acetaminophen 5-325 MG tablet  Commonly known as:  PERCOCET/ROXICET  Take 1 tablet by mouth every 6 (six) hours as needed for moderate pain or severe pain.     prenatal multivitamin Tabs tablet  Take 1 tablet by mouth daily at 12 noon.        Diet: routine diet  Activity: Advance as tolerated. Pelvic rest  for 6 weeks.   Outpatient follow up: Follow up at [facility] in 6 weeks  Postpartum contraception: undecided, discussed options while breast feeding Rhogam Given postpartum: yes, baby O POS Rubella vaccine given postpartum: no Varicella vaccine given postpartum: no TDaP given antepartum or postpartum: TDaP given 07/02/15 Flu vaccine given antepartum or postpartum: yes  Newborn Data: Live born female Erica Glenn Weight:  7#13.9oz APGAR: 9, 9   Baby Feeding: Breast  Disposition:home with mother  Farrel ConnersGUTIERREZ, Nicholette Dolson, CNM

## 2016-03-13 NOTE — Anesthesia Procedure Notes (Signed)
Epidural Patient location during procedure: OB Start time: 03/13/2016 5:54 AM End time: 03/13/2016 6:20 AM  Staffing Anesthesiologist: Lenard SimmerKARENZ, Tesneem Dufrane Performed by: anesthesiologist   Preanesthetic Checklist Completed: patient identified, site marked, surgical consent, pre-op evaluation, timeout performed, IV checked, risks and benefits discussed and monitors and equipment checked  Epidural Patient position: sitting Prep: ChloraPrep Patient monitoring: heart rate, continuous pulse ox and blood pressure Approach: midline Location: L4-L5 Injection technique: LOR saline  Needle:  Needle type: Tuohy  Needle gauge: 17 G Needle length: 9 cm and 9 Needle insertion depth: 5 cm Catheter type: closed end flexible Catheter size: 19 Gauge Catheter at skin depth: 9.5 cm Test dose: negative and 1.5% lidocaine with Epi 1:200 K  Assessment Events: blood not aspirated, injection not painful, no injection resistance, negative IV test and no paresthesia  Additional Notes   Patient tolerated the insertion well without complications.Reason for block:procedure for pain

## 2016-03-13 NOTE — OB Triage Note (Signed)
Pt arrived from ED with c/o SROM at home around 0100 and contractions. Pt placed on monitors and oriented to room.

## 2016-03-13 NOTE — Anesthesia Preprocedure Evaluation (Signed)
Anesthesia Evaluation  Patient identified by MRN, date of birth, ID band Patient awake    Reviewed: Allergy & Precautions, H&P , NPO status , Patient's Chart, lab work & pertinent test results, reviewed documented beta blocker date and time   History of Anesthesia Complications Negative for: history of anesthetic complications  Airway Mallampati: III  TM Distance: >3 FB Neck ROM: full    Dental no notable dental hx. (+) Teeth Intact   Pulmonary neg shortness of breath, asthma , neg sleep apnea, neg recent URI,    Pulmonary exam normal breath sounds clear to auscultation       Cardiovascular Exercise Tolerance: Good negative cardio ROS Normal cardiovascular exam Rhythm:regular Rate:Normal     Neuro/Psych negative neurological ROS  negative psych ROS   GI/Hepatic negative GI ROS, Neg liver ROS,   Endo/Other  negative endocrine ROS  Renal/GU negative Renal ROS  negative genitourinary   Musculoskeletal   Abdominal   Peds  Hematology negative hematology ROS (+)   Anesthesia Other Findings Past Medical History:   H/O gestational diabetes in prior pregnancy, c* 2015           Comment:Scolosis   Asthma                                                       Reproductive/Obstetrics negative OB ROS                             Anesthesia Physical Anesthesia Plan  ASA: II  Anesthesia Plan: Epidural   Post-op Pain Management:    Induction:   Airway Management Planned:   Additional Equipment:   Intra-op Plan:   Post-operative Plan:   Informed Consent: I have reviewed the patients History and Physical, chart, labs and discussed the procedure including the risks, benefits and alternatives for the proposed anesthesia with the patient or authorized representative who has indicated his/her understanding and acceptance.   Dental Advisory Given  Plan Discussed with: Anesthesiologist, CRNA  and Surgeon  Anesthesia Plan Comments:         Anesthesia Quick Evaluation

## 2016-03-14 LAB — CBC
HCT: 34.4 % — ABNORMAL LOW (ref 35.0–47.0)
Hemoglobin: 11.3 g/dL — ABNORMAL LOW (ref 12.0–16.0)
MCH: 25.7 pg — ABNORMAL LOW (ref 26.0–34.0)
MCHC: 32.7 g/dL (ref 32.0–36.0)
MCV: 78.7 fL — ABNORMAL LOW (ref 80.0–100.0)
PLATELETS: 246 10*3/uL (ref 150–440)
RBC: 4.38 MIL/uL (ref 3.80–5.20)
RDW: 16.7 % — ABNORMAL HIGH (ref 11.5–14.5)
WBC: 15.4 10*3/uL — AB (ref 3.6–11.0)

## 2016-03-14 LAB — RPR: RPR Ser Ql: NONREACTIVE

## 2016-03-14 LAB — FETAL SCREEN: Fetal Screen: NEGATIVE

## 2016-03-14 MED ORDER — RHO D IMMUNE GLOBULIN 1500 UNIT/2ML IJ SOSY
300.0000 ug | PREFILLED_SYRINGE | Freq: Once | INTRAMUSCULAR | Status: AC
  Administered 2016-03-14: 300 ug via INTRAMUSCULAR
  Filled 2016-03-14: qty 2

## 2016-03-14 NOTE — Progress Notes (Signed)
I have reviewed all charting done by Andrea Cox (student RN).Erica Lehn RN 

## 2016-03-14 NOTE — Progress Notes (Signed)
Post Partum Day 1 Subjective: Voiding without difficulty, tolerating regualr diet, cramping can be intense, breastfeeding  Objective: Blood pressure 119/85, pulse 87, temperature 97.8 F (36.6 C), temperature source Oral, resp. rate 18, height 5' (1.524 m), weight 78.019 kg (172 lb), last menstrual period 06/21/2015, SpO2 100 %, unknown if currently breastfeeding.  Physical Exam:  General: alert, cooperative and no distress Lochia: appropriate Uterine Fundus: firm/ at U/ ML/ NT DVT Evaluation: No evidence of DVT seen on physical exam.   Recent Labs  03/13/16 0416 03/14/16 0553  HGB 11.8* 11.3*  HCT 36.1 34.4*  WBC 12.6* 15.4*  PLT 261 246    Assessment/Plan: Stable ppd #1 Continue postpartum care Probable discharge tomorrow Breast O neg and baby O pos-received Rhogam today Vi/ RI TDAP UTD   LOS: 1 day   Lydia Toren 03/14/2016, 12:42 PM

## 2016-03-14 NOTE — Anesthesia Postprocedure Evaluation (Signed)
Anesthesia Post Note  Patient: Erica Glenn  Procedure(s) Performed: * No procedures listed *  Patient location during evaluation: Mother Baby Anesthesia Type: Epidural Level of consciousness: awake and alert Pain management: pain level controlled Vital Signs Assessment: post-procedure vital signs reviewed and stable Respiratory status: spontaneous breathing, nonlabored ventilation and respiratory function stable Cardiovascular status: stable Postop Assessment: no headache, no backache and epidural receding Anesthetic complications: no    Last Vitals:  Filed Vitals:   03/14/16 0000 03/14/16 0430  BP: 113/64 115/63  Pulse: 106 95  Temp: 36.7 C 36.8 C  Resp: 18 16    Last Pain:  Filed Vitals:   03/14/16 0500  PainSc: 3                  [name],  [name] A

## 2016-03-15 LAB — RHOGAM INJECTION: Unit division: 0

## 2016-03-15 MED ORDER — IBUPROFEN 600 MG PO TABS: 600.0000 mg | ORAL_TABLET | Freq: Four times a day (QID) | ORAL | Status: DC

## 2016-03-15 MED ORDER — OXYCODONE-ACETAMINOPHEN 5-325 MG PO TABS: 1.0000 | ORAL_TABLET | Freq: Four times a day (QID) | ORAL | Status: DC | PRN

## 2016-03-15 NOTE — Discharge Instructions (Signed)
Vaginal Delivery, Care After Refer to this sheet in the next few weeks. These discharge instructions provide you with information on caring for yourself after delivery. Your caregiver may also give you specific instructions. Your treatment has been planned according to the most current medical practices available, but problems sometimes occur. Call your caregiver if you have any problems or questions after you go home. HOME CARE INSTRUCTIONS 1. Take over-the-counter or prescription medicines only as directed by your caregiver or pharmacist. 2. Do not drink alcohol, especially if you are breastfeeding or taking medicine to relieve pain. 3. Do not smoke tobacco. 4. Continue to use good perineal care. Good perineal care includes: 1. Wiping your perineum from back to front 2. Keeping your perineum clean. 3. You can do sitz baths twice a day, to help keep this area clean 5. Do not use tampons, douche or have sex for 6 weeks postpartum 6. Shower only and avoid sitting in submerged water, aside from sitz baths 7. Wear a well-fitting bra that provides breast support. 8. Eat healthy foods. 9. Drink enough fluids to keep your urine clear or pale yellow. 10. Eat high-fiber foods such as whole grain cereals and breads, brown rice, beans, and fresh fruits and vegetables every day. These foods may help prevent or relieve constipation. 11. Avoid constipation with high fiber foods or medications, such as miralax or metamucil 12. Follow your caregiver's recommendations regarding resumption of activities such as climbing stairs, driving, lifting, exercising, or traveling. 13. You can resume sexual activities in 6 weeks. Resumption of sexual activities is dependent upon  your rate of healing, and your comfort and desire to resume sexual activity.  14. Try to have someone help you with your household activities and your newborn for at least a few days after you leave the hospital. 15. Rest as much as possible.  Try to rest or take a nap when your newborn is sleeping. 16. Increase your activities gradually. 17. Keep all of your scheduled postpartum appointments. It is very important to keep your scheduled follow-up appointments. At these appointments, your caregiver will be checking to make sure that you are healing physically and emotionally. SEEK MEDICAL CARE IF:   You are passing large clots from your vagina. Save any clots to show your caregiver.  You have a foul smelling discharge from your vagina.  You have trouble urinating.  You are urinating frequently.  You have pain when you urinate.  You have a change in your bowel movements.  You have increasing redness, pain, or swelling near your vaginal incision (episiotomy) or vaginal tear.  You have pus draining from your episiotomy or vaginal tear.  Your episiotomy or vaginal tear is separating.  You have painful, hard, or reddened breasts.  You have a severe headache.  You have blurred vision or see spots.  You feel sad or depressed.  You have thoughts of hurting yourself or your newborn.  You have questions about your care, the care of your newborn, or medicines.  You are dizzy or light-headed.  You have a rash.  You have nausea or vomiting.  You were breastfeeding and have not had a menstrual period within 12 weeks after you stopped breastfeeding.  You are not breastfeeding and have not had a menstrual period by the 12th week after delivery.  You have a fever. SEEK IMMEDIATE MEDICAL CARE IF:   You have persistent pain.  You have chest pain.  You have shortness of breath.  You faint.  You  have leg pain.  You have stomach pain.  Your vaginal bleeding saturates two or more sanitary pads in 1 hour. MAKE SURE YOU:   Understand these instructions.  Will watch your condition.  Will get help right away if you are not doing well or get worse. Document Released: 11/10/2000 Document Revised: 03/30/2014 Document  Reviewed: 07/10/2012 [facility] Patient Information 2015 [place], [place]. This information is not intended to replace advice given to you by your health care provider. Make sure you discuss any questions you have with your health care provider.  Sitz Bath A sitz bath is a warm water bath taken in the sitting position. The water covers only the hips and butt (buttocks). We recommend using one that fits in the toilet, to help with ease of use and cleanliness. It may be used for either healing or cleaning purposes. Sitz baths are also used to relieve pain, itching, or muscle tightening (spasms). The water may contain medicine. Moist heat will help you heal and relax.  HOME CARE  Take 3 to 4 sitz baths a day. 18. Fill the bathtub half-full with warm water. 19. Sit in the water and open the drain a little. 20. Turn on the warm water to keep the tub half-full. Keep the water running constantly. 21. Soak in the water for 15 to 20 minutes. 22. After the sitz bath, pat the affected area dry. GET HELP RIGHT AWAY IF: You get worse instead of better. Stop the sitz baths if you get worse. MAKE SURE YOU:  Understand these instructions.  Will watch your condition.  Will get help right away if you are not doing well or get worse. Document Released: 12/21/2004 Document Revised: 08/07/2012 Document Reviewed: 03/13/2011 [facility] Patient Information 2015 [place], [place]. This information is not intended to replace advice given to you by your health care provider. Make sure you discuss any questions you have with your health care provider.  Discharged home with infant via w/c

## 2016-03-15 NOTE — Progress Notes (Signed)
I have verified all charting done by Andrea Cox (student RN). Nashton Belson RN 

## 2016-03-22 ENCOUNTER — Ambulatory Visit
Admission: RE | Admit: 2016-03-22 | Discharge: 2016-03-22 | Disposition: A | Discharge status: Home / Self Care | Payer: BLUE CROSS/BLUE SHIELD | Source: Ambulatory Visit | Attending: Internal Medicine | Admitting: Internal Medicine

## 2016-03-22 NOTE — Lactation Note (Signed)
Lactation Consultation Note  Patient Name: Vicenta DunningKianie S Scala WUJWJ'XToday's Date: 03/22/2016     Maternal Data  Mother was referred by baby's pediatrician for slow weight gain due to his inability to stay latched on during feedings. Mom was told to supplement with 25mL of formula if baby did not latch for feedings and to hand express or pump in order to maintain milk supply until she could see LC.  Feeding  "AJ" was able to successfully feed for 25mins with lots of swallows with the help of a 20mm nipple shield.  Baby took 3oz for mom's left breast during visit and was too full to feed from right side.   LATCH Score/Interventions  Mom needs to use 20mm nipple shield during every feeding. 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 Mom feels very comfortable with breastfeeding now. This is her second time breastfeeding. She has a 1520 month old who she breastfed for 6mos. Mom was encouraged nurse for the next 24hrs exclusively and only to supplement if baby did not get lots of audible swallow and/or does not appear full. Mom was also encouraged to join breastfeeding support groups.                     Lactation Tools Discussed/Used     Consult Status      Burnadette PeterJaniya M Apollo Timothy 03/22/2016, 1:04 PM

## 2016-04-23 ENCOUNTER — Emergency Department
Admission: EM | Admit: 2016-04-23 | Discharge: 2016-04-23 | Disposition: A | Discharge status: Home / Self Care | Payer: BLUE CROSS/BLUE SHIELD | Attending: Emergency Medicine | Admitting: Emergency Medicine

## 2016-04-23 ENCOUNTER — Encounter: Payer: Self-pay | Admitting: Emergency Medicine

## 2016-04-23 DIAGNOSIS — J45909 Unspecified asthma, uncomplicated: Secondary | ICD-10-CM | POA: Diagnosis not present

## 2016-04-23 DIAGNOSIS — H00022 Hordeolum internum right lower eyelid: Secondary | ICD-10-CM | POA: Diagnosis present

## 2016-04-23 DIAGNOSIS — H00032 Abscess of right lower eyelid: Secondary | ICD-10-CM | POA: Insufficient documentation

## 2016-04-23 DIAGNOSIS — H00033 Abscess of eyelid right eye, unspecified eyelid: Secondary | ICD-10-CM

## 2016-04-23 MED ORDER — CEPHALEXIN 500 MG PO CAPS
ORAL_CAPSULE | ORAL | Status: AC
  Administered 2016-04-23: 500 mg via ORAL
  Filled 2016-04-23: qty 1

## 2016-04-23 MED ORDER — CEPHALEXIN 500 MG PO CAPS: 500.0000 mg | ORAL_CAPSULE | Freq: Two times a day (BID) | ORAL | Status: AC

## 2016-04-23 MED ORDER — CEPHALEXIN 500 MG PO CAPS
500.0000 mg | ORAL_CAPSULE | Freq: Once | ORAL | Status: AC
  Administered 2016-04-23: 500 mg via ORAL

## 2016-04-23 NOTE — ED Notes (Addendum)
Pt to ED via POV with c/o left eye stye x 1week ago, pt states two new pus pockets formed yesterday with green drainage. Pt denies blurred vision or fever

## 2016-04-23 NOTE — ED Provider Notes (Signed)
[facility] Emergency Department Provider Note ____________________________________________  Time seen: Approximately 8:33 PM  I have reviewed the triage vital signs and the nursing notes.   HISTORY  Chief Complaint Stye   HPI Erica Glenn is a 20 y.o. female who presents to the emergency department for evaluation of infection of the right lower eyelid. She states that she had a stye in the upper lid about a week ago, but yesterday she had 2 areas forearm on the lower lid that are very tender and draining. She states that over the last 24 hours she has noticed some swelling in her lower lid. She has not used any ointment or medication in the eye.  Past Medical History  Diagnosis Date  . H/O gestational diabetes in prior pregnancy, currently pregnant 2015    Scolosis  . Asthma     Patient Active Problem List   Diagnosis Date Noted  . Postpartum care following vaginal delivery 03/11/2016  . Proteinuria affecting pregnancy in third trimester 02/24/2016  . Asthma 02/24/2016    Past Surgical History  Procedure Laterality Date  . No past surgeries      Current Outpatient Rx  Name  Route  Sig  Dispense  Refill  . cephALEXin (KEFLEX) 500 MG capsule   Oral   Take 1 capsule (500 mg total) by mouth 2 (two) times daily.   20 capsule   0   . ibuprofen (ADVIL,MOTRIN) 600 MG tablet   Oral   Take 1 tablet (600 mg total) by mouth every 6 (six) hours.   30 tablet   0   . oxyCODONE-acetaminophen (PERCOCET/ROXICET) 5-325 MG tablet   Oral   Take 1 tablet by mouth every 6 (six) hours as needed for moderate pain or severe pain.   12 tablet   0   . Prenatal Vit-Fe Fumarate-FA (PRENATAL MULTIVITAMIN) TABS tablet   Oral   Take 1 tablet by mouth daily at 12 noon.           Allergies Review of patient's allergies indicates no known allergies.  No family history on file.  Social History Social History  Substance Use Topics  . Smoking status: Never  Smoker   . Smokeless tobacco: Never Used  . Alcohol Use: No    Review of Systems   Constitutional: No fever/chills Eyes: Negative for visual changes. Negative for pain. Positive for yellow drainage of the right lower eyelid. Musculoskeletal: Negative for pain. Skin: Negative for rash. Neurological: Negative for headaches, focal weakness or numbness. Allergic: Negative for seasonal allergies. ____________________________________________  PHYSICAL EXAM:  VITAL SIGNS: ED Triage Vitals  Enc Vitals Group     BP 04/23/16 2026 118/82 mmHg     Pulse Rate 04/23/16 2026 72     Resp 04/23/16 2026 16     Temp 04/23/16 2026 98.5 F (36.9 C)     Temp Source 04/23/16 2026 Oral     SpO2 04/23/16 2026 98 %     Weight --      Height --      Head Cir --      Peak Flow --      Pain Score 04/23/16 2022 6     Pain Loc --      Pain Edu? --      Excl. in GC? --     Constitutional: Alert and oriented. Well appearing and in no acute distress. Eyes: Visual acuity--see nursing documentation; No globe trauma; Eyelids--right lower lid with purulent drainage noted  consistent with large stye that has formed a small abscess which has now caused the lower lid to become erythematous and edematous; left eyelids unaffected; Sclera appears normal.  Eyelids were inverted. Conjunctiva appears normal; Cornea normal on visual exam. Head: Atraumatic. Nose: No congestion/rhinnorhea. Mouth/Throat: Mucous membranes are moist.  Oropharynx non-erythematous. Respiratory: Respirations even and unlabored Musculoskeletal:Normal ROM x 4 extremities. Neurologic:  Normal speech and language. No gross focal neurologic deficits are appreciated. Speech is normal. No gait instability. Skin:  Skin is warm, dry and intact. No rash noted. Psychiatric: Mood and affect are normal. Speech and behavior are normal.  ____________________________________________   LABS (all labs ordered are listed, but only abnormal results are  displayed)  Labs Reviewed - No data to display ____________________________________________  EKG   ____________________________________________  RADIOLOGY   ____________________________________________   PROCEDURES  Procedure(s) performed: None  ____________________________________________   INITIAL IMPRESSION / ASSESSMENT AND PLAN / ED COURSE  Pertinent labs & imaging results that were available during my care of the patient were reviewed by me and considered in my medical decision making (see chart for details).  Patient to receive prescriptions for Keflex.  She was advised to follow up with ophthalmology for symptoms that are not improving over the next 2-3 days. She was  also advised to return to the ER for symptoms that change or worsen if unable to schedule an appointment.  ____________________________________________   FINAL CLINICAL IMPRESSION(S) / ED DIAGNOSES  Final diagnoses:  Eyelid abscess, right    Note:  This document was prepared using Dragon voice recognition software and may include unintentional dictation errors.    Chinita Pester, FNP 04/23/16 2133  Jeanmarie Plant, MD 04/23/16 2209

## 2016-07-12 ENCOUNTER — Emergency Department
Admission: EM | Admit: 2016-07-12 | Discharge: 2016-07-12 | Disposition: A | Discharge status: Home / Self Care | Payer: Medicaid Other | Attending: Emergency Medicine | Admitting: Emergency Medicine

## 2016-07-12 ENCOUNTER — Encounter: Payer: Self-pay | Admitting: *Deleted

## 2016-07-12 DIAGNOSIS — J45909 Unspecified asthma, uncomplicated: Secondary | ICD-10-CM | POA: Insufficient documentation

## 2016-07-12 DIAGNOSIS — T814XXA Infection following a procedure, initial encounter: Secondary | ICD-10-CM | POA: Diagnosis present

## 2016-07-12 DIAGNOSIS — L03311 Cellulitis of abdominal wall: Secondary | ICD-10-CM | POA: Insufficient documentation

## 2016-07-12 DIAGNOSIS — Y818 Miscellaneous general- and plastic-surgery devices associated with adverse incidents, not elsewhere classified: Secondary | ICD-10-CM | POA: Diagnosis not present

## 2016-07-12 DIAGNOSIS — T8140XA Infection following a procedure, unspecified, initial encounter: Secondary | ICD-10-CM

## 2016-07-12 LAB — CBC WITH DIFFERENTIAL/PLATELET
BASOS ABS: 0.1 10*3/uL (ref 0–0.1)
Basophils Relative: 1 %
EOS ABS: 1 10*3/uL — AB (ref 0–0.7)
EOS PCT: 8 %
HCT: 40.4 % (ref 35.0–47.0)
HEMOGLOBIN: 13 g/dL (ref 12.0–16.0)
LYMPHS ABS: 4.6 10*3/uL — AB (ref 1.0–3.6)
Lymphocytes Relative: 33 %
MCH: 26.5 pg (ref 26.0–34.0)
MCHC: 32.3 g/dL (ref 32.0–36.0)
MCV: 82.2 fL (ref 80.0–100.0)
Monocytes Absolute: 0.5 10*3/uL (ref 0.2–0.9)
Monocytes Relative: 4 %
Neutro Abs: 7.5 10*3/uL — ABNORMAL HIGH (ref 1.4–6.5)
Neutrophils Relative %: 54 %
PLATELETS: 322 10*3/uL (ref 150–440)
RBC: 4.91 MIL/uL (ref 3.80–5.20)
RDW: 14.4 % (ref 11.5–14.5)
WBC: 13.8 10*3/uL — AB (ref 3.6–11.0)

## 2016-07-12 LAB — COMPREHENSIVE METABOLIC PANEL
ALT: 35 U/L (ref 14–54)
AST: 26 U/L (ref 15–41)
Albumin: 4.3 g/dL (ref 3.5–5.0)
Alkaline Phosphatase: 116 U/L (ref 38–126)
Anion gap: 5 (ref 5–15)
BILIRUBIN TOTAL: 0.5 mg/dL (ref 0.3–1.2)
BUN: 12 mg/dL (ref 6–20)
CHLORIDE: 108 mmol/L (ref 101–111)
CO2: 26 mmol/L (ref 22–32)
CREATININE: 0.61 mg/dL (ref 0.44–1.00)
Calcium: 8.8 mg/dL — ABNORMAL LOW (ref 8.9–10.3)
Glucose, Bld: 93 mg/dL (ref 65–99)
Potassium: 3.2 mmol/L — ABNORMAL LOW (ref 3.5–5.1)
Sodium: 139 mmol/L (ref 135–145)
TOTAL PROTEIN: 7.6 g/dL (ref 6.5–8.1)

## 2016-07-12 MED ORDER — CEPHALEXIN 500 MG PO CAPS: 500.0000 mg | ORAL_CAPSULE | Freq: Three times a day (TID) | ORAL | 0 refills | Status: DC

## 2016-07-12 NOTE — ED Provider Notes (Signed)
[facility] Emergency Department Provider Note  ____________________________________________  Time seen: Approximately 7:45 PM  I have reviewed the triage vital signs and the nursing notes.   HISTORY  Chief Complaint Post-op Problem    HPI Erica Glenn is a 20 y.o. female who complains of pain and redness on the right side of her abdomen at the site of her recent lap or septic surgery. She had laparoscopic cholecystectomy done a few weeks ago. Everything is been going fine, no nausea or vomiting. Eating and drinking normally. Normal bowel movements. Pain is controlled. However over the last 2 days she's had some pain and redness developing over the slap or septic area on the right abdominal wall. No purulent drainage. Otherwise feeling okay.     Past Medical History:  Diagnosis Date  . Asthma   . H/O gestational diabetes in prior pregnancy, currently pregnant 2015   Scolosis     Patient Active Problem List   Diagnosis Date Noted  . Postpartum care following vaginal delivery 03/11/2016  . Proteinuria affecting pregnancy in third trimester 02/24/2016  . Asthma 02/24/2016     Past Surgical History:  Procedure Laterality Date  . NO PAST SURGERIES       Prior to Admission medications   Medication Sig Start Date End Date Taking? Authorizing Provider  cephALEXin (KEFLEX) 500 MG capsule Take 1 capsule (500 mg total) by mouth 3 (three) times daily. 07/12/16   Sharman CheekPhillip Harless Molinari, MD  ibuprofen (ADVIL,MOTRIN) 600 MG tablet Take 1 tablet (600 mg total) by mouth every 6 (six) hours. 03/15/16   Farrel Connersolleen Gutierrez, CNM  oxyCODONE-acetaminophen (PERCOCET/ROXICET) 5-325 MG tablet Take 1 tablet by mouth every 6 (six) hours as needed for moderate pain or severe pain. 03/15/16   Farrel Connersolleen Gutierrez, CNM  Prenatal Vit-Fe Fumarate-FA (PRENATAL MULTIVITAMIN) TABS tablet Take 1 tablet by mouth daily at 12 noon.    Historical Provider, MD     Allergies Review of  patient's allergies indicates no known allergies.   No family history on file.  Social History Social History  Substance Use Topics  . Smoking status: Never Smoker  . Smokeless tobacco: Never Used  . Alcohol use No    Review of Systems  Constitutional:   No fever or chills.  ENT:   No sore throat. No rhinorrhea. Cardiovascular:   No chest pain. Respiratory:   No dyspnea or cough. Gastrointestinal:   Abdominal wall pain as above. No vomiting or diarrhea.  Genitourinary:   Negative for dysuria or difficulty urinating. Musculoskeletal:   Negative for focal pain or swelling Neurological:   Negative for headaches 10-point ROS otherwise negative.  ____________________________________________   PHYSICAL EXAM:  VITAL SIGNS: ED Triage Vitals  Enc Vitals Group     BP 07/12/16 1539 110/71     Pulse Rate 07/12/16 1539 98     Resp 07/12/16 1539 20     Temp 07/12/16 1539 99 F (37.2 C)     Temp Source 07/12/16 1539 Oral     SpO2 07/12/16 1539 96 %     Weight 07/12/16 1541 150 lb (68 kg)     Height 07/12/16 1541 4\' 11"  (1.499 m)     Head Circumference --      Peak Flow --      Pain Score 07/12/16 1541 5     Pain Loc --      Pain Edu? --      Excl. in GC? --     Vital signs reviewed,  nursing assessments reviewed.   Constitutional:   Alert and oriented. Well appearing and in no distress. Eyes:   No scleral icterus. No conjunctival pallor. PERRL. EOMI.  No nystagmus. ENT   Head:   Normocephalic and atraumatic.     Mouth/Throat:   MMM, no pharyngeal erythema. No peritonsillar mass.    Hematological/Lymphatic/Immunilogical:   No cervical lymphadenopathy. Cardiovascular:   RRR. Symmetric bilateral radial and DP pulses.  No murmurs.  Respiratory:   Normal respiratory effort without tachypnea nor retractions. Breath sounds are clear and equal bilaterally. No wheezes/rales/rhonchi. Gastrointestinal:   Soft and nontenderTo deep palpation of the abdomen. The right  abdominal wall has a healing laparoscopic incision in the mid right abdomen that is surrounded by your regularly border area of erythema. No streaking. No induration or purulent drainage. No crepitus.. Non distended. There is no CVA tenderness.  No rebound, rigidity, or guarding. Genitourinary:   deferred Musculoskeletal:   Nontender with normal range of motion in all extremities. No joint effusions.  No lower extremity tenderness.  No edema. Neurologic:   Normal speech and language.  CN 2-10 normal. Motor grossly intact. No gross focal neurologic deficits are appreciated.  Skin:    Skin is warm, dry and intact. Other laparoscopic incisions well-healed. Erythema as above.  ____________________________________________    LABS (pertinent positives/negatives) (all labs ordered are listed, but only abnormal results are displayed) Labs Reviewed  COMPREHENSIVE METABOLIC PANEL - Abnormal; Notable for the following:       Result Value   Potassium 3.2 (*)    Calcium 8.8 (*)    All other components within normal limits  CBC WITH DIFFERENTIAL/PLATELET - Abnormal; Notable for the following:    WBC 13.8 (*)    Neutro Abs 7.5 (*)    Lymphs Abs 4.6 (*)    Eosinophils Absolute 1.0 (*)    All other components within normal limits   ____________________________________________   EKG    ____________________________________________    RADIOLOGY    ____________________________________________   PROCEDURES Procedures  ____________________________________________   INITIAL IMPRESSION / ASSESSMENT AND PLAN / ED COURSE  Pertinent labs & imaging results that were available during my care of the patient were reviewed by me and considered in my medical decision making (see chart for details).  Patient presents with erythema and pain and tenderness on the right abdominal wall around the laparoscopic incision, consistent with cellulitis and postop infection. No evidence of abscess or deeper  soft tissue or abdominal infection. No necrotizing fasciitis. We'll start on Keflex, follow-up with her surgeon.     Clinical Course   ____________________________________________   FINAL CLINICAL IMPRESSION(S) / ED DIAGNOSES  Final diagnoses:  Post op infection  Cellulitis of abdominal wall       Portions of this note were generated with dragon dictation software. Dictation errors may occur despite best attempts at proofreading.    Sharman CheekPhillip Jahir Halt, MD 07/12/16 1949

## 2016-07-12 NOTE — ED Notes (Signed)
Discharge instructions reviewed with patient. Patient verbalized understanding. Patient ambulated to lobby without difficulty.   

## 2016-07-12 NOTE — ED Triage Notes (Signed)
Pt reports she [facility] last month.  Today, pt noticed drainage from incision and has pain for 3 days.  Area red.  No drainage at this time.

## 2017-11-27 ENCOUNTER — Other Ambulatory Visit: Payer: Self-pay

## 2017-11-27 ENCOUNTER — Encounter: Payer: Self-pay | Admitting: *Deleted

## 2017-11-27 ENCOUNTER — Ambulatory Visit
Admission: EM | Admit: 2017-11-27 | Discharge: 2017-11-27 | Disposition: A | Discharge status: Home / Self Care | Payer: Medicaid Other | Attending: Family Medicine | Admitting: Family Medicine

## 2017-11-27 DIAGNOSIS — M5431 Sciatica, right side: Secondary | ICD-10-CM

## 2017-11-27 MED ORDER — CYCLOBENZAPRINE HCL 10 MG PO TABS: 10.0000 mg | ORAL_TABLET | Freq: Every day | ORAL | 0 refills | Status: DC

## 2017-11-27 MED ORDER — HYDROCODONE-ACETAMINOPHEN 5-325 MG PO TABS: ORAL_TABLET | ORAL | 0 refills | Status: DC

## 2017-11-27 MED ORDER — PREDNISONE 20 MG PO TABS: 20.0000 mg | ORAL_TABLET | Freq: Every day | ORAL | 0 refills | Status: DC

## 2017-11-27 NOTE — ED Provider Notes (Signed)
[facility]    CSN: [id number] Arrival date & time: 11/27/17  1856     History   Chief Complaint Chief Complaint  Patient presents with  . Back Pain    HPI Erica Glenn is a 22 y.o. female.   22 yo female with a c/o right sided low back pain radiating down the back of the right leg for the past 1-2 weeks. Denies any fall, injuries, numbness/tingling, bowel/bladder problems, saddle anesthesia, fevers, chills, rash.    The history is provided by the patient.  Back Pain    Past Medical History:  Diagnosis Date  . Asthma   . H/O gestational diabetes in prior pregnancy, currently pregnant 2015   Scolosis    Patient Active Problem List   Diagnosis Date Noted  . Postpartum care following vaginal delivery 03/11/2016  . Proteinuria affecting pregnancy in third trimester 02/24/2016  . Asthma 02/24/2016    Past Surgical History:  Procedure Laterality Date  . NO PAST SURGERIES      OB History    Gravida Para Term Preterm AB Living   2 2 2     2    SAB TAB Ectopic Multiple Live Births         0 2       Home Medications    Prior to Admission medications   Medication Sig Start Date End Date Taking? Authorizing Provider  ibuprofen (ADVIL,MOTRIN) 600 MG tablet Take 1 tablet (600 mg total) by mouth every 6 (six) hours. 03/15/16  Yes Farrel ConnersGutierrez, Colleen, CNM  cephALEXin (KEFLEX) 500 MG capsule Take 1 capsule (500 mg total) by mouth 3 (three) times daily. 07/12/16   Sharman CheekStafford, Phillip, MD  cyclobenzaprine (FLEXERIL) 10 MG tablet Take 1 tablet (10 mg total) by mouth at bedtime. 11/27/17   Payton Mccallumonty, Shivaay Stormont, MD  HYDROcodone-acetaminophen (NORCO/VICODIN) 5-325 MG tablet 1-2 tabs po bid prn 11/27/17   Payton Mccallumonty, Evalee Gerard, MD  oxyCODONE-acetaminophen (PERCOCET/ROXICET) 5-325 MG tablet Take 1 tablet by mouth every 6 (six) hours as needed for moderate pain or severe pain. 03/15/16   Farrel ConnersGutierrez, Colleen, CNM  predniSONE (DELTASONE) 20 MG tablet Take 1 tablet (20 mg total) by mouth  daily. 11/27/17   Payton Mccallumonty, Monaye Blackie, MD  Prenatal Vit-Fe Fumarate-FA (PRENATAL MULTIVITAMIN) TABS tablet Take 1 tablet by mouth daily at 12 noon.    [provider]    Family History History reviewed. No pertinent family history.  Social History Social History   Tobacco Use  . Smoking status: Never Smoker  . Smokeless tobacco: Never Used  Substance Use Topics  . Alcohol use: No  . Drug use: No     Allergies   Patient has no known allergies.   Review of Systems Review of Systems  Musculoskeletal: Positive for back pain.     Physical Exam Triage Vital Signs ED Triage Vitals  Enc Vitals Group     BP 11/27/17 1906 133/80     Pulse Rate 11/27/17 1906 83     Resp 11/27/17 1906 16     Temp 11/27/17 1906 98.1 F (36.7 C)     Temp Source 11/27/17 1906 Oral     SpO2 11/27/17 1906 100 %     Weight 11/27/17 1907 165 lb (74.8 kg)     Height 11/27/17 1907 5' (1.524 m)     Head Circumference --      Peak Flow --      Pain Score 11/27/17 1907 8     Pain Loc --  Pain Edu? --      Excl. in GC? --    No data found.  Updated Vital Signs BP 133/80 (BP Location: Left Arm)   Pulse 83   Temp 98.1 F (36.7 C) (Oral)   Resp 16   Ht 5' (1.524 m)   Wt 165 lb (74.8 kg)   LMP 11/14/2017   SpO2 100%   BMI 32.22 kg/m   Visual Acuity Right Eye Distance:   Left Eye Distance:   Bilateral Distance:    Right Eye Near:   Left Eye Near:    Bilateral Near:     Physical Exam  Constitutional: She appears well-developed and well-nourished. No distress.  Musculoskeletal: She exhibits tenderness. She exhibits no edema.       Lumbar back: She exhibits tenderness and spasm. She exhibits normal range of motion, no bony tenderness (over the lumbosacral paraspinous and right buttock), no swelling, no edema, no deformity, no laceration, no pain and normal pulse.  Neurological: She is alert. She has normal reflexes. She displays normal reflexes. She exhibits normal muscle tone.    Skin: Skin is warm and dry. No rash noted. She is not diaphoretic. No erythema.  Nursing note and vitals reviewed.    UC Treatments / Results  Labs (all labs ordered are listed, but only abnormal results are displayed) Labs Reviewed - No data to display  EKG  EKG Interpretation None       Radiology No results found.  Procedures Procedures (including critical care time)  Medications Ordered in UC Medications - No data to display   Initial Impression / Assessment and Plan / UC Course  I have reviewed the triage vital signs and the nursing notes.  Pertinent labs & imaging results that were available during my care of the patient were reviewed by me and considered in my medical decision making (see chart for details).       Final Clinical Impressions(s) / UC Diagnoses   Final diagnoses:  Sciatica of right side    ED Discharge Orders        Ordered    predniSONE (DELTASONE) 20 MG tablet  Daily     11/27/17 1929    cyclobenzaprine (FLEXERIL) 10 MG tablet  Daily at bedtime     11/27/17 1929    HYDROcodone-acetaminophen (NORCO/VICODIN) 5-325 MG tablet     11/27/17 1932     1. diagnosis reviewed with patient 2. rx as per orders above; reviewed possible side effects, interactions, risks and benefits  3. Recommend supportive treatment with rest, ice/heat 4. Follow-up prn if symptoms worsen or don't improve  Controlled Substance Prescriptions [place] Controlled Substance Registry consulted? Not Applicable   Payton Mccallum, MD 11/27/17 [phone]

## 2017-11-27 NOTE — ED Triage Notes (Signed)
Patient started having lower back pain that radiates to the right leg 2 weeks ago. Mechanism of injury unknown.

## 2018-01-06 ENCOUNTER — Other Ambulatory Visit: Payer: Self-pay

## 2018-01-06 ENCOUNTER — Ambulatory Visit
Admission: EM | Admit: 2018-01-06 | Discharge: 2018-01-06 | Disposition: A | Discharge status: Home / Self Care | Payer: Medicaid Other | Attending: Family Medicine | Admitting: Family Medicine

## 2018-01-06 DIAGNOSIS — Z8632 Personal history of gestational diabetes: Secondary | ICD-10-CM | POA: Insufficient documentation

## 2018-01-06 DIAGNOSIS — J45909 Unspecified asthma, uncomplicated: Secondary | ICD-10-CM | POA: Diagnosis not present

## 2018-01-06 DIAGNOSIS — J020 Streptococcal pharyngitis: Secondary | ICD-10-CM

## 2018-01-06 DIAGNOSIS — R040 Epistaxis: Secondary | ICD-10-CM

## 2018-01-06 DIAGNOSIS — J039 Acute tonsillitis, unspecified: Secondary | ICD-10-CM

## 2018-01-06 DIAGNOSIS — R0981 Nasal congestion: Secondary | ICD-10-CM | POA: Insufficient documentation

## 2018-01-06 DIAGNOSIS — J029 Acute pharyngitis, unspecified: Secondary | ICD-10-CM | POA: Diagnosis present

## 2018-01-06 LAB — RAPID STREP SCREEN (MED CTR MEBANE ONLY): STREPTOCOCCUS, GROUP A SCREEN (DIRECT): POSITIVE — AB

## 2018-01-06 LAB — RAPID INFLUENZA A&B ANTIGENS (ARMC ONLY): INFLUENZA A (ARMC): NEGATIVE

## 2018-01-06 LAB — RAPID INFLUENZA A&B ANTIGENS: Influenza B (ARMC): NEGATIVE

## 2018-01-06 MED ORDER — AMOXICILLIN 875 MG PO TABS: 875.0000 mg | ORAL_TABLET | Freq: Two times a day (BID) | ORAL | 0 refills | Status: DC

## 2018-01-06 MED ORDER — LIDOCAINE VISCOUS 2 % MT SOLN: 15.0000 mL | Freq: Three times a day (TID) | OROMUCOSAL | 0 refills | Status: DC | PRN

## 2018-01-06 MED ORDER — DEXAMETHASONE SODIUM PHOSPHATE 10 MG/ML IJ SOLN
10.0000 mg | Freq: Once | INTRAMUSCULAR | Status: AC
  Administered 2018-01-06: 10 mg via INTRAMUSCULAR

## 2018-01-06 NOTE — Discharge Instructions (Signed)
Take medication as prescribed. Rest. Drink plenty of fluids.  ° °Follow up with your primary care physician this week as needed. Return to Urgent care for new or worsening concerns.  ° °

## 2018-01-06 NOTE — ED Triage Notes (Addendum)
Pt with sore throat pain since yesterday. Has bloody secretions from nose. Pt's children were both diagnosed with flu this week. Pain 8/10. Also reports feeling very run-down

## 2018-01-06 NOTE — ED Provider Notes (Signed)
[facility] ____________________________________________  Time seen: Approximately 1145 AM  I have reviewed the triage vital signs and the nursing notes.   HISTORY  Chief Complaint Sore Throat   HPI Erica Glenn is a 22 y.o. female presenting for evaluation of sore throat that is been present since yesterday upon awakening.  Patient reports she has had some occasional nasal congestion as well that had been clear, but reports today when she blew her nose forcefully she had some blood present.  Denies any other nosebleeds or atypical bleeding.  States no history of same in the past.  States not much of a cough or continued nasal congestion.  Reports sore throat is moderate at this time.  States has been able to eat and drink, but does hurt to swallow, drinking fluids well.  Reports her 2 young children had influenza this past week.  Denies known strep contacts.  Has taken some over-the-counter ibuprofen this morning which helps some.  States that she feels like she has been running a fever yesterday and today, did not measure.  Reports healthy person.  Denies recent antibiotic use.  Denies pregnancy. Denies chest pain, shortness of breath, abdominal pain, or rash  Department, [facility]: PCP Patient's last menstrual period was 12/23/2017.   Past Medical History:  Diagnosis Date  . Asthma   . H/O gestational diabetes in prior pregnancy, currently pregnant 2015   Scolosis    Patient Active Problem List   Diagnosis Date Noted  . Postpartum care following vaginal delivery 03/11/2016  . Proteinuria affecting pregnancy in third trimester 02/24/2016  . Asthma 02/24/2016    Past Surgical History:  Procedure Laterality Date  . NO PAST SURGERIES       No current facility-administered medications for this encounter.   Current Outpatient Medications:  .  levonorgestrel (MIRENA) 20 MCG/24HR IUD, 1 each by Intrauterine route once., Disp: , Rfl:  .   amoxicillin (AMOXIL) 875 MG tablet, Take 1 tablet (875 mg total) by mouth 2 (two) times daily., Disp: 20 tablet, Rfl: 0 .  lidocaine (XYLOCAINE) 2 % solution, Use as directed 15 mLs in the mouth or throat every 8 (eight) hours as needed (sore throat. gargle and spit as needed for sore throat.)., Disp: 100 mL, Rfl: 0  Allergies Patient has no known allergies.  Family History  Problem Relation Age of Onset  . Healthy Mother     Social History Social History   Tobacco Use  . Smoking status: Never Smoker  . Smokeless tobacco: Never Used  Substance Use Topics  . Alcohol use: No  . Drug use: No    Review of Systems Constitutional: As above ENT: Positive sore throat. Cardiovascular: Denies chest pain. Respiratory: Denies shortness of breath. Gastrointestinal: No abdominal pain.   Musculoskeletal: Negative for back pain. Skin: Negative for rash. Neurological: Negative for focal weakness or numbness.   ____________________________________________   PHYSICAL EXAM:  VITAL SIGNS: ED Triage Vitals  Enc Vitals Group     BP 01/06/18 1100 122/62     Pulse Rate 01/06/18 1100 100     Resp 01/06/18 1100 20     Temp 01/06/18 1100 98.7 F (37.1 C)     Temp src --      SpO2 01/06/18 1100 99 %     Weight 01/06/18 1106 165 lb (74.8 kg)     Height 01/06/18 1106 5' (1.524 m)     Head Circumference --      Peak Flow --  Pain Score 01/06/18 1106 8     Pain Loc --      Pain Edu? --      Excl. in GC? --     Constitutional: Alert and oriented. Well appearing and in no acute distress. Eyes: Conjunctivae are normal. PERRL. EOMI. Head: Atraumatic. No sinus tenderness to palpation. No swelling. No erythema.  Ears: no erythema, normal TMs bilaterally.   Nose: No nasal congestion noted at this time.  No rhinorrhea.  No epistaxis.  Bilateral nasal turbinates mild erythema and edema noted.  No dried blood.  Bilateral nares patent.  No nasal tenderness.  Mouth/Throat: Mucous membranes are  moist. Mild pharyngeal erythema. 2-3+ bilateral tonsillar swelling with scattered bilateral exudate.  No uvular shift or deviation.  Tolerating oral fluids.  Speech clear. Neck: No stridor.  No cervical spine tenderness to palpation. Hematological/Lymphatic/Immunilogical: Anterior bilateral cervical lymphadenopathy. Cardiovascular: Normal rate, regular rhythm. Grossly normal heart sounds.  Good peripheral circulation. Respiratory: Normal respiratory effort.  No retractions. No wheezes, rales or rhonchi. Good air movement.  Musculoskeletal: Ambulatory with steady gait. No cervical, thoracic or lumbar tenderness to palpation. Neurologic:  Normal speech and language. No gait instability. Skin:  Skin appears warm, dry and intact. No rash noted. Psychiatric: Mood and affect are normal. Speech and behavior are normal. ___________________________________________   LABS (all labs ordered are listed, but only abnormal results are displayed)  Labs Reviewed  RAPID STREP SCREEN (NOT AT [facility]) - Abnormal; Notable for the following components:      Result Value   Streptococcus, Group A Screen (Direct) POSITIVE (*)    All other components within normal limits  RAPID INFLUENZA A&B ANTIGENS ([facility] ONLY)   ____________________________________________  RADIOLOGY  No results found. ____________________________________________   PROCEDURES Procedures    INITIAL IMPRESSION / ASSESSMENT AND PLAN / ED COURSE  Pertinent labs & imaging results that were available during my care of the patient were reviewed by me and considered in my medical decision making (see chart for details).  Well-appearing patient.  No acute distress.  Continues to tolerate fluids well.  Strep positive.  Discussed treatment options with patient.  Patient declined Bicillin IM, elected oral amoxicillin.  Agreed to IM Decadron.  10 mg IM Decadron given once in urgent care.  Also, as needed viscous lidocaine gargles as needed.   Encourage rest, fluids, supportive care, over-the-counter Tylenol and ibuprofen.  Work note given.Discussed indication, risks and benefits of medications with patient.  Encouraged supportive care, avoidance of forceful nose blowing, monitoring.  Discussed follow up with Primary care physician this week. Discussed follow up and return parameters including no resolution or any worsening concerns. Patient verbalized understanding and agreed to plan.   ____________________________________________   FINAL CLINICAL IMPRESSION(S) / ED DIAGNOSES  Final diagnoses:  Strep throat  Exudative tonsillitis  Epistaxis     ED Discharge Orders        Ordered    amoxicillin (AMOXIL) 875 MG tablet  2 times daily     01/06/18 1145    lidocaine (XYLOCAINE) 2 % solution  Every 8 hours PRN     01/06/18 1145       Note: This dictation was prepared with Dragon dictation along with smaller phrase technology. Any transcriptional errors that result from this process are unintentional.         Renford Dills, NP 01/06/18 1216

## 2018-05-05 ENCOUNTER — Encounter: Payer: Self-pay | Admitting: Gynecology

## 2018-05-05 ENCOUNTER — Other Ambulatory Visit: Payer: Self-pay

## 2018-05-05 ENCOUNTER — Ambulatory Visit
Admission: EM | Admit: 2018-05-05 | Discharge: 2018-05-05 | Disposition: A | Discharge status: Home / Self Care | Payer: Medicaid Other | Attending: Family Medicine | Admitting: Family Medicine

## 2018-05-05 DIAGNOSIS — L989 Disorder of the skin and subcutaneous tissue, unspecified: Secondary | ICD-10-CM

## 2018-05-05 DIAGNOSIS — R21 Rash and other nonspecific skin eruption: Secondary | ICD-10-CM

## 2018-05-05 MED ORDER — MUPIROCIN 2 % EX OINT: 1.0000 "application " | TOPICAL_OINTMENT | Freq: Two times a day (BID) | CUTANEOUS | 0 refills | Status: AC

## 2018-05-05 NOTE — ED Provider Notes (Signed)
[facility]  CSN: [id number] Arrival date & time: 05/05/18  1335  History   Chief Complaint Chief Complaint  Patient presents with  . Rash   HPI  22 year old female presents with the above complaint.  Patient reports that Monday she had some areas pop up underneath her neck.  They have scabbed over.  No reports of bites or injury.  She is unsure the etiology.  It is itchy.  No known exacerbating or relieving factors.  No fever.  No other associated symptoms.  No other complaints.  Past Medical History:  Diagnosis Date  . Asthma   . H/O gestational diabetes in prior pregnancy, currently pregnant 2015   Scolosis   Patient Active Problem List   Diagnosis Date Noted  . Postpartum care following vaginal delivery 03/11/2016  . Proteinuria affecting pregnancy in third trimester 02/24/2016  . Asthma 02/24/2016   Past Surgical History:  Procedure Laterality Date  . NO PAST SURGERIES      OB History    Gravida  2   Para  2   Term  2   Preterm      AB      Living  2     SAB      TAB      Ectopic      Multiple  0   Live Births  2          Home Medications    Prior to Admission medications   Medication Sig Start Date End Date Taking? Authorizing Provider  levonorgestrel (MIRENA) 20 MCG/24HR IUD 1 each by Intrauterine route once.   Yes [provider]  mupirocin ointment (BACTROBAN) 2 % Apply 1 application topically 2 (two) times daily for 7 days. 05/05/18 05/12/18  Tommie Samsook, [place] Surges G, DO    Family History Family History  Problem Relation Age of Onset  . Healthy Mother     Social History Social History   Tobacco Use  . Smoking status: Never Smoker  . Smokeless tobacco: Never Used  Substance Use Topics  . Alcohol use: No  . Drug use: No     Allergies   Patient has no known allergies.   Review of Systems Review of Systems  Constitutional: Negative.   Skin: Positive for rash.   Physical Exam Triage Vital Signs ED Triage  Vitals  Enc Vitals Group     BP 05/05/18 1349 111/82     Pulse Rate 05/05/18 1349 70     Resp 05/05/18 1349 16     Temp 05/05/18 1349 98.8 F (37.1 C)     Temp Source 05/05/18 1349 Oral     SpO2 05/05/18 1349 98 %     Weight 05/05/18 1351 165 lb (74.8 kg)     Height 05/05/18 1351 5' (1.524 m)     Head Circumference --      Peak Flow --      Pain Score 05/05/18 1351 0     Pain Loc --      Pain Edu? --      Excl. in GC? --    Updated Vital Signs BP 111/82 (BP Location: Left Arm)   Pulse 70   Temp 98.8 F (37.1 C) (Oral)   Resp 16   Ht 5' (1.524 m)   Wt 165 lb (74.8 kg)   LMP 05/05/2018   SpO2 98%   BMI 32.22 kg/m      Physical Exam  Constitutional: She is oriented to person, place, and  time. She appears well-developed. No distress.  HENT:  Head: Normocephalic and atraumatic.  Neck:    Patient with areas of eschar.  She also has 2 open lesions which are very small.  No surrounding erythema.  No fluctuance.  No drainage.  Cardiovascular: Normal rate and regular rhythm.  Pulmonary/Chest: Effort normal. No respiratory distress.  Neurological: She is alert and oriented to person, place, and time.  Psychiatric: She has a normal mood and affect. Her behavior is normal.  Nursing note and vitals reviewed.   UC Treatments / Results  Labs (all labs ordered are listed, but only abnormal results are displayed) Labs Reviewed - No data to display  EKG None  Radiology No results found.  Procedures Procedures (including critical care time)  Medications Ordered in UC Medications - No data to display  Initial Impression / Assessment and Plan / UC Course  I have reviewed the triage vital signs and the nursing notes.  Pertinent labs & imaging results that were available during my care of the patient were reviewed by me and considered in my medical decision making (see chart for details).    22 year old female presents with a few open lesions as well as lesions that are  scabbing over.  Etiology is unclear.  Treating with topical mupirocin.  Supportive care.  Final Clinical Impressions(s) / UC Diagnoses   Final diagnoses:  Rash and nonspecific skin eruption     Discharge Instructions     Etiology unclear.  Topical antibiotic as directed.  Take care  Dr. Adriana Simas    ED Prescriptions    Medication Sig Dispense Auth. Provider   mupirocin ointment (BACTROBAN) 2 % Apply 1 application topically 2 (two) times daily for 7 days. 22 g Tommie Sams, DO     Controlled Substance Prescriptions [place] Controlled Substance Registry consulted? Not Applicable   Tommie Sams, DO 05/05/18 1529

## 2018-05-05 NOTE — Discharge Instructions (Signed)
Etiology unclear.  Topical antibiotic as directed.  Take care  Dr. Adriana Simasook

## 2018-05-05 NOTE — ED Triage Notes (Signed)
Per patient notice rash / scratches under neck x couple days ago.

## 2018-08-29 ENCOUNTER — Ambulatory Visit
Admission: EM | Admit: 2018-08-29 | Discharge: 2018-08-29 | Disposition: A | Discharge status: Home / Self Care | Payer: Medicaid Other | Attending: Family Medicine | Admitting: Family Medicine

## 2018-08-29 ENCOUNTER — Other Ambulatory Visit: Payer: Self-pay

## 2018-08-29 DIAGNOSIS — L02412 Cutaneous abscess of left axilla: Secondary | ICD-10-CM | POA: Diagnosis not present

## 2018-08-29 MED ORDER — DOXYCYCLINE HYCLATE 100 MG PO CAPS: 100.0000 mg | ORAL_CAPSULE | Freq: Two times a day (BID) | ORAL | 0 refills | Status: DC

## 2018-08-29 NOTE — ED Triage Notes (Signed)
Patient complains of boil underneath left arm x 4 days.

## 2018-08-29 NOTE — ED Provider Notes (Signed)
[facility]    CSN: [id number] Arrival date & time: 08/29/18  1406  History   Chief Complaint Chief Complaint  Patient presents with  . Abscess   HPI 22 year old female presents with an abscess in her left axilla.  4-day history of abscess to the left axilla.  She has had abscesses previously.  She states that it is red, swollen.  No drainage.  She is been using warm compresses without relief.  Pain is mild to moderate currently.  No fever.  No chills.  No other associated symptoms.  No other complaints.  PMH, Surgical Hx, Family Hx, Social History reviewed and updated as below.  Past Medical History:  Diagnosis Date  . Asthma   . H/O gestational diabetes in prior pregnancy, currently pregnant 2015   Scolosis   Patient Active Problem List   Diagnosis Date Noted  . Postpartum care following vaginal delivery 03/11/2016  . Proteinuria affecting pregnancy in third trimester 02/24/2016  . Asthma 02/24/2016    Past Surgical History:  Procedure Laterality Date  . NO PAST SURGERIES      OB History    Gravida  2   Para  2   Term  2   Preterm      AB      Living  2     SAB      TAB      Ectopic      Multiple  0   Live Births  2            Home Medications    Prior to Admission medications   Medication Sig Start Date End Date Taking? Authorizing Provider  levonorgestrel (MIRENA) 20 MCG/24HR IUD 1 each by Intrauterine route once.   Yes [provider]  doxycycline (VIBRAMYCIN) 100 MG capsule Take 1 capsule (100 mg total) by mouth 2 (two) times daily. 08/29/18   Tommie Sams, DO    Family History Family History  Problem Relation Age of Onset  . Healthy Mother     Social History Social History   Tobacco Use  . Smoking status: Never Smoker  . Smokeless tobacco: Never Used  Substance Use Topics  . Alcohol use: No  . Drug use: No     Allergies   Patient has no known allergies.   Review of Systems Review of Systems    Constitutional: Negative.   Skin:       Abscess - Left axilla.   Physical Exam Triage Vital Signs ED Triage Vitals  Enc Vitals Group     BP 08/29/18 1423 114/76     Pulse Rate 08/29/18 1423 (!) 111     Resp 08/29/18 1423 18     Temp 08/29/18 1423 98.8 F (37.1 C)     Temp Source 08/29/18 1423 Oral     SpO2 08/29/18 1423 99 %     Weight 08/29/18 1422 160 lb (72.6 kg)     Height 08/29/18 1422 4\' 11"  (1.499 m)     Head Circumference --      Peak Flow --      Pain Score 08/29/18 1422 10     Pain Loc --      Pain Edu? --      Excl. in GC? --    Updated Vital Signs BP 114/76 (BP Location: Left Arm)   Pulse (!) 111   Temp 98.8 F (37.1 C) (Oral)   Resp 18   Ht 4\' 11"  (1.499 m)  Wt 72.6 kg   SpO2 99%   Breastfeeding? No   BMI 32.32 kg/m   Visual Acuity Right Eye Distance:   Left Eye Distance:   Bilateral Distance:    Right Eye Near:   Left Eye Near:    Bilateral Near:     Physical Exam  Constitutional: She is oriented to person, place, and time. She appears well-developed. No distress.  HENT:  Head: Normocephalic and atraumatic.  Pulmonary/Chest: Effort normal. No respiratory distress.  Neurological: She is alert and oriented to person, place, and time.  Skin:  Left axilla -raised, erythematous, mildly fluctuant area consistent with abscess.  Psychiatric: She has a normal mood and affect. Her behavior is normal.  Nursing note and vitals reviewed.  UC Treatments / Results  Labs (all labs ordered are listed, but only abnormal results are displayed) Labs Reviewed - No data to display  EKG None  Radiology No results found.  Procedures Procedures (including critical care time)  Medications Ordered in UC Medications - No data to display  Initial Impression / Assessment and Plan / UC Course  I have reviewed the triage vital signs and the nursing notes.  Pertinent labs & imaging results that were available during my care of the patient were reviewed by  me and considered in my medical decision making (see chart for details).    22 year old female presents with abscess.  Patient elected against incision and drainage at this time.  She preferred conservative care with oral antibiotics and warm compresses.  Placing on doxycycline.  Final Clinical Impressions(s) / UC Diagnoses   Final diagnoses:  Abscess of left axilla     Discharge Instructions     Warm compresses.  Antibiotic as prescribed.  Take with food.  Take care  Dr. Adriana Simas    ED Prescriptions    Medication Sig Dispense Auth. Provider   doxycycline (VIBRAMYCIN) 100 MG capsule Take 1 capsule (100 mg total) by mouth 2 (two) times daily. 14 capsule Tommie Sams, DO     Controlled Substance Prescriptions  Controlled Substance Registry consulted? Not Applicable   Tommie Sams, DO 08/29/18 1538

## 2018-08-29 NOTE — Discharge Instructions (Signed)
Warm compresses.  Antibiotic as prescribed.  Take with food.  Take care  Dr. Adriana Simas

## 2019-01-08 ENCOUNTER — Encounter: Payer: Self-pay | Admitting: Emergency Medicine

## 2019-01-08 ENCOUNTER — Other Ambulatory Visit: Payer: Self-pay

## 2019-01-08 ENCOUNTER — Emergency Department: Payer: Medicaid Other

## 2019-01-08 ENCOUNTER — Emergency Department
Admission: EM | Admit: 2019-01-08 | Discharge: 2019-01-08 | Disposition: A | Discharge status: Home / Self Care | Payer: Medicaid Other | Attending: Emergency Medicine | Admitting: Emergency Medicine

## 2019-01-08 DIAGNOSIS — Z79899 Other long term (current) drug therapy: Secondary | ICD-10-CM | POA: Diagnosis not present

## 2019-01-08 DIAGNOSIS — R0789 Other chest pain: Secondary | ICD-10-CM | POA: Diagnosis not present

## 2019-01-08 DIAGNOSIS — J45909 Unspecified asthma, uncomplicated: Secondary | ICD-10-CM | POA: Insufficient documentation

## 2019-01-08 DIAGNOSIS — R079 Chest pain, unspecified: Secondary | ICD-10-CM | POA: Diagnosis present

## 2019-01-08 LAB — URINALYSIS, COMPLETE (UACMP) WITH MICROSCOPIC
Bacteria, UA: NONE SEEN
Bilirubin Urine: NEGATIVE
Glucose, UA: NEGATIVE mg/dL
Hgb urine dipstick: NEGATIVE
KETONES UR: NEGATIVE mg/dL
Nitrite: NEGATIVE
PH: 7 (ref 5.0–8.0)
Protein, ur: NEGATIVE mg/dL
Specific Gravity, Urine: 1.026 (ref 1.005–1.030)

## 2019-01-08 LAB — CBC WITH DIFFERENTIAL/PLATELET
ABS IMMATURE GRANULOCYTES: 0.03 10*3/uL (ref 0.00–0.07)
Basophils Absolute: 0.1 10*3/uL (ref 0.0–0.1)
Basophils Relative: 1 %
Eosinophils Absolute: 1 10*3/uL — ABNORMAL HIGH (ref 0.0–0.5)
Eosinophils Relative: 7 %
HEMATOCRIT: 39.5 % (ref 36.0–46.0)
HEMOGLOBIN: 12.8 g/dL (ref 12.0–15.0)
Immature Granulocytes: 0 %
LYMPHS PCT: 43 %
Lymphs Abs: 5.9 10*3/uL — ABNORMAL HIGH (ref 0.7–4.0)
MCH: 26.5 pg (ref 26.0–34.0)
MCHC: 32.4 g/dL (ref 30.0–36.0)
MCV: 81.8 fL (ref 80.0–100.0)
MONO ABS: 0.7 10*3/uL (ref 0.1–1.0)
MONOS PCT: 5 %
NEUTROS ABS: 5.9 10*3/uL (ref 1.7–7.7)
Neutrophils Relative %: 44 %
Platelets: 347 10*3/uL (ref 150–400)
RBC: 4.83 MIL/uL (ref 3.87–5.11)
RDW: 12.4 % (ref 11.5–15.5)
WBC: 13.6 10*3/uL — ABNORMAL HIGH (ref 4.0–10.5)
nRBC: 0 % (ref 0.0–0.2)

## 2019-01-08 LAB — POCT PREGNANCY, URINE: PREG TEST UR: NEGATIVE

## 2019-01-08 LAB — COMPREHENSIVE METABOLIC PANEL
ALK PHOS: 80 U/L (ref 38–126)
ALT: 28 U/L (ref 0–44)
AST: 24 U/L (ref 15–41)
Albumin: 4 g/dL (ref 3.5–5.0)
Anion gap: 4 — ABNORMAL LOW (ref 5–15)
BUN: 14 mg/dL (ref 6–20)
CO2: 27 mmol/L (ref 22–32)
CREATININE: 0.59 mg/dL (ref 0.44–1.00)
Calcium: 8.9 mg/dL (ref 8.9–10.3)
Chloride: 107 mmol/L (ref 98–111)
Glucose, Bld: 93 mg/dL (ref 70–99)
Potassium: 3.6 mmol/L (ref 3.5–5.1)
Sodium: 138 mmol/L (ref 135–145)
Total Bilirubin: 0.5 mg/dL (ref 0.3–1.2)
Total Protein: 7.5 g/dL (ref 6.5–8.1)

## 2019-01-08 LAB — TROPONIN I

## 2019-01-08 LAB — FIBRIN DERIVATIVES D-DIMER (ARMC ONLY): Fibrin derivatives D-dimer (ARMC): 222.2 ng/mL (FEU) (ref 0.00–499.00)

## 2019-01-08 NOTE — ED Provider Notes (Addendum)
[facility] Emergency Department Provider Note  ____________________________________________   I have reviewed the triage vital signs and the nursing notes. Where available I have reviewed prior notes and, if possible and indicated, outside hospital notes.    HISTORY  Chief Complaint Chest Pain    HPI Erica Glenn is a 23 y.o. female who is healthy, states that she has some right-sided chest wall discomfort, she states it is "sore".  She denies any shortness of breath, no exertional symptoms.  She woke up after sleeping on it wrong.  Is uncomfortable to touch and uncomfortable when she moves her arm.  The patient states that to accept her region of the pectoralis muscle, or at least she indicates it does by pointing, to her right shoulder.  She denies any exertional symptoms.  Is been hurting her remainder of the day since she woke up but getting gradually better.  No recollected trauma.  No leg swelling, no personal or family history of PE DVT, she does have a Mirena birth control device in her uterus.  She has no recent travel recent surgery.  History of asthma but does not feel that she is having an asthma attack.  Pain is worse when she touches it better when she sits still.   Past Medical History:  Diagnosis Date  . Asthma   . H/O gestational diabetes in prior pregnancy, currently pregnant 2015   Scolosis    Patient Active Problem List   Diagnosis Date Noted  . Postpartum care following vaginal delivery 03/11/2016  . Proteinuria affecting pregnancy in third trimester 02/24/2016  . Asthma 02/24/2016    Past Surgical History:  Procedure Laterality Date  . NO PAST SURGERIES      Prior to Admission medications   Medication Sig Start Date End Date Taking? Authorizing Provider  doxycycline (VIBRAMYCIN) 100 MG capsule Take 1 capsule (100 mg total) by mouth 2 (two) times daily. 08/29/18   Tommie Samsook, Jayce G, DO  levonorgestrel (MIRENA) 20 MCG/24HR IUD 1  each by Intrauterine route once.    [provider]    Allergies Patient has no known allergies.  Family History  Problem Relation Age of Onset  . Healthy Mother     Social History Social History   Tobacco Use  . Smoking status: Never Smoker  . Smokeless tobacco: Never Used  Substance Use Topics  . Alcohol use: No  . Drug use: No    Review of Systems Constitutional: No fever/chills Eyes: No visual changes. ENT: No sore throat. No stiff neck no neck pain Cardiovascular: + chest pain. Respiratory: Denies shortness of breath. Gastrointestinal:   no vomiting.  No diarrhea.  No constipation. Genitourinary: Negative for dysuria. Musculoskeletal: Negative lower extremity swelling Skin: Negative for rash. Neurological: Negative for severe headaches, focal weakness or numbness.   ____________________________________________   PHYSICAL EXAM:  VITAL SIGNS: ED Triage Vitals  Enc Vitals Group     BP 01/08/19 2016 120/74     Pulse Rate 01/08/19 2016 68     Resp 01/08/19 2016 18     Temp 01/08/19 2016 98.7 F (37.1 C)     Temp src --      SpO2 01/08/19 2016 99 %     Weight 01/08/19 2001 160 lb (72.6 kg)     Height 01/08/19 2001 4\' 11"  (1.499 m)     Head Circumference --      Peak Flow --      Pain Score 01/08/19 2001 6  Pain Loc --      Pain Edu? --      Excl. in [place]? --     Constitutional: Alert and oriented. Well appearing and in no acute distress. Eyes: Conjunctivae are normal Head: Atraumatic HEENT: No congestion/rhinnorhea. Mucous membranes are moist.  Oropharynx non-erythematous Neck:   Nontender with no meningismus, no masses, no stridor Cardiovascular: Normal rate, regular rhythm. Grossly normal heart sounds.  Good peripheral circulation. Chest: Tender palpation of the right chest wall when I tested her patient says "obsessive pain right there" and pulls back.  There is no crepitus there is no flail chest.,  Patient's breasts were not exposed or  touched during this exam and her sister was there as chaperone.  I did offer a female nurse chaperone but she declined.  Patient has very reproducible tenderness along the pectoralis muscle.  Also when I range her arm, it pulls in the muscle and makes it uncomfortable. Respiratory: Normal respiratory effort.  No retractions. Lungs CTAB. Abdominal: Soft and nontender. No distention. No guarding no rebound Back:  There is no focal tenderness or step off.  there is no midline tenderness there are no lesions noted. there is no CVA tenderness  Musculoskeletal: No lower extremity tenderness, no upper extremity tenderness. No joint effusions, no DVT signs strong distal pulses no edema Neurologic:  Normal speech and language. No gross focal neurologic deficits are appreciated.  Skin:  Skin is warm, dry and intact. No rash noted. Psychiatric: Mood and affect are normal. Speech and behavior are normal.  ____________________________________________   LABS (all labs ordered are listed, but only abnormal results are displayed)  Labs Reviewed  CBC WITH DIFFERENTIAL/PLATELET - Abnormal; Notable for the following components:      Result Value   WBC 13.6 (*)    Lymphs Abs 5.9 (*)    Eosinophils Absolute 1.0 (*)    All other components within normal limits  COMPREHENSIVE METABOLIC PANEL - Abnormal; Notable for the following components:   Anion gap 4 (*)    All other components within normal limits  URINALYSIS, COMPLETE (UACMP) WITH MICROSCOPIC - Abnormal; Notable for the following components:   Color, Urine YELLOW (*)    APPearance TURBID (*)    Leukocytes,Ua SMALL (*)    All other components within normal limits  TROPONIN I  FIBRIN DERIVATIVES D-DIMER ([facility] ONLY)  POC URINE PREG, ED  POCT PREGNANCY, URINE    Pertinent labs  results that were available during my care of the patient were reviewed by me and considered in my medical decision making (see chart for  details). ____________________________________________  EKG  I personally interpreted any EKGs ordered by me or triage Sinus rate 77, no acute ST elevation or depression nonspecific ST changes no change from prior, some sinus dysrhythmia noted ____________________________________________  RADIOLOGY  Pertinent labs & imaging results that were available during my care of the patient were reviewed by me and considered in my medical decision making (see chart for details). If possible, patient and/or family made aware of any abnormal findings.  Dg Chest 2 View  Result Date: 01/08/2019 CLINICAL DATA:  23 year old female with a history of chest pain EXAM: CHEST - 2 VIEW COMPARISON:  None. FINDINGS: Cardiomediastinal silhouette within normal limits. No pneumothorax. No pleural effusion. Scoliotic curvature of the thoracic spine, apex right midthoracic spine. No confluent airspace disease. No displaced fracture. IMPRESSION: Negative for acute cardiopulmonary disease. Significant scoliotic curvature of the thoracic spine. Electronically Signed   By: Gilmer MorJaime  Wagner  D.O.   On: 01/08/2019 20:36   ____________________________________________    PROCEDURES  Procedure(s) performed: None  Procedures  Critical Care performed: None  ____________________________________________   INITIAL IMPRESSION / ASSESSMENT AND PLAN / ED COURSE  Pertinent labs & imaging results that were available during my care of the patient were reviewed by me and considered in my medical decision making (see chart for details).  Patient here with very reproducible chest wall pain very low suspicion for PE.  No real risk factors aside from the Mirena which is best marginally implicated in pulmonary embolus.  We will send a d-dimer.  Patient is in no acute distress, and I do not think ACS is likely troponin is negative as well pain on the EKG is reassuring etc.  We will discharge her with reassurance if her d-dimer is negative.   At this time, there does not appear to be clinical evidence to support the diagnosis of pulmonary embolus, dissection, myocarditis, endocarditis, pericarditis, pericardial tamponade, acute coronary syndrome, pneumothorax, pneumonia, or any other acute intrathoracic pathology that will require admission or acute intervention. Nor is there evidence of any significant intra-abdominal pathology causing this discomfort.    ____________________________________________   FINAL CLINICAL IMPRESSION(S) / ED DIAGNOSES  Final diagnoses:  None      This chart was dictated using voice recognition software.  Despite best efforts to proofread,  errors can occur which can change meaning.      Jeanmarie Plant, MD 01/08/19 2141    Jeanmarie Plant, MD 01/08/19 2144

## 2019-01-08 NOTE — ED Triage Notes (Signed)
Patient ambulatory to triage with steady gait, without difficulty or distress noted; pt reports mid CP radiating down right arm accomp by [facility] since this am; denies hx of same

## 2019-08-04 ENCOUNTER — Ambulatory Visit
Admission: EM | Admit: 2019-08-04 | Discharge: 2019-08-04 | Disposition: A | Discharge status: Home / Self Care | Payer: Medicaid Other | Attending: Family Medicine | Admitting: Family Medicine

## 2019-08-04 ENCOUNTER — Other Ambulatory Visit: Payer: Self-pay

## 2019-08-04 DIAGNOSIS — W57XXXA Bitten or stung by nonvenomous insect and other nonvenomous arthropods, initial encounter: Secondary | ICD-10-CM | POA: Diagnosis not present

## 2019-08-04 DIAGNOSIS — R21 Rash and other nonspecific skin eruption: Secondary | ICD-10-CM

## 2019-08-04 MED ORDER — HYDROXYZINE HCL 25 MG PO TABS: 25.0000 mg | ORAL_TABLET | Freq: Three times a day (TID) | ORAL | 0 refills | Status: DC | PRN

## 2019-08-04 MED ORDER — TRIAMCINOLONE ACETONIDE 0.5 % EX OINT: 1.0000 "application " | TOPICAL_OINTMENT | Freq: Two times a day (BID) | CUTANEOUS | 0 refills | Status: DC

## 2019-08-04 NOTE — Discharge Instructions (Signed)
Medications as prescribed. ° °Take care ° °Dr. Lamarcus Spira  °

## 2019-08-04 NOTE — ED Provider Notes (Signed)
[facility]    CSN: [id number] Arrival date & time: 08/04/19  1348  History   Chief Complaint Chief Complaint  Patient presents with  . Rash    HPI  23 year old female presents with rash.  Patient reports that over the past 2 weeks she has suffered from what she believes to be bug bites.  She has several areas which are very itchy.  Located on her arms, legs.  She reports some associated redness.  No medications or interventions tried.  No relieving factors.  No known inciting factor.  No other reported symptoms.  No other complaints.  PMH, Surgical Hx, Family Hx, Social History reviewed and updated as below.  Past Medical History:  Diagnosis Date  . Asthma   . H/O gestational diabetes in prior pregnancy, currently pregnant 2015   Scolosis    Patient Active Problem List   Diagnosis Date Noted  . Postpartum care following vaginal delivery 03/11/2016  . Proteinuria affecting pregnancy in third trimester 02/24/2016  . Asthma 02/24/2016    Past Surgical History:  Procedure Laterality Date  . NO PAST SURGERIES      OB History    Gravida  2   Para  2   Term  2   Preterm      AB      Living  2     SAB      TAB      Ectopic      Multiple  0   Live Births  2            Home Medications    Prior to Admission medications   Medication Sig Start Date End Date Taking? Authorizing Provider  levonorgestrel (MIRENA) 20 MCG/24HR IUD 1 each by Intrauterine route once.   Yes [provider]  hydrOXYzine (ATARAX/VISTARIL) 25 MG tablet Take 1 tablet (25 mg total) by mouth every 8 (eight) hours as needed for itching. 08/04/19   Coral Spikes, DO  triamcinolone ointment (KENALOG) 0.5 % Apply 1 application topically 2 (two) times daily. 08/04/19   Coral Spikes, DO    Family History Family History  Problem Relation Age of Onset  . Healthy Mother     Social History Social History   Tobacco Use  . Smoking status: Never Smoker  . Smokeless  tobacco: Never Used  Substance Use Topics  . Alcohol use: No  . Drug use: No     Allergies   Patient has no known allergies.   Review of Systems Review of Systems  Constitutional: Negative.   Skin:       Bug bites.   Physical Exam Triage Vital Signs ED Triage Vitals  Enc Vitals Group     BP 08/04/19 1402 117/74     Pulse Rate 08/04/19 1402 82     Resp 08/04/19 1402 18     Temp 08/04/19 1402 99 F (37.2 C)     Temp Source 08/04/19 1402 Oral     SpO2 08/04/19 1402 100 %     Weight 08/04/19 1400 169 lb (76.7 kg)     Height 08/04/19 1400 4\' 11"  (1.499 m)     Head Circumference --      Peak Flow --      Pain Score 08/04/19 1400 0     Pain Loc --      Pain Edu? --      Excl. in [place]? --    Updated Vital Signs BP 117/74 (BP Location:  Left Arm)   Pulse 82   Temp 99 F (37.2 C) (Oral)   Resp 18   Ht 4\' 11"  (1.499 m)   Wt 76.7 kg   SpO2 100%   BMI 34.13 kg/m   Visual Acuity Right Eye Distance:   Left Eye Distance:   Bilateral Distance:    Right Eye Near:   Left Eye Near:    Bilateral Near:     Physical Exam Vitals signs and nursing note reviewed.  Constitutional:      General: She is not in acute distress.    Appearance: Normal appearance.  HENT:     Head: Normocephalic and atraumatic.  Eyes:     General:        Right eye: No discharge.        Left eye: No discharge.     Conjunctiva/sclera: Conjunctivae normal.  Cardiovascular:     Rate and Rhythm: Normal rate and regular rhythm.  Pulmonary:     Effort: Pulmonary effort is normal.     Breath sounds: Normal breath sounds. No wheezing, rhonchi or rales.  Skin:    Comments: Arms, and lower extremities with scattered areas of erythema.  Neurological:     Mental Status: She is alert.  Psychiatric:        Mood and Affect: Mood normal.        Behavior: Behavior normal.    UC Treatments / Results  Labs (all labs ordered are listed, but only abnormal results are displayed) Labs Reviewed - No data to  display  EKG   Radiology No results found.  Procedures Procedures (including critical care time)  Medications Ordered in UC Medications - No data to display  Initial Impression / Assessment and Plan / UC Course  I have reviewed the triage vital signs and the nursing notes.  Pertinent labs & imaging results that were available during my care of the patient were reviewed by me and considered in my medical decision making (see chart for details).    23 year old female presents with bug bites.  Treating with Atarax and triamcinolone.  Final Clinical Impressions(s) / UC Diagnoses   Final diagnoses:  Bug bite, initial encounter     Discharge Instructions     Medications as prescribed.  Take care  Dr. Adriana Simasook    ED Prescriptions    Medication Sig Dispense Auth. Provider   hydrOXYzine (ATARAX/VISTARIL) 25 MG tablet Take 1 tablet (25 mg total) by mouth every 8 (eight) hours as needed for itching. 30 tablet Riggin Cuttino G, DO   triamcinolone ointment (KENALOG) 0.5 % Apply 1 application topically 2 (two) times daily. 30 g Tommie Samsook, Trew Sunde G, DO     Controlled Substance Prescriptions [place] Controlled Substance Registry consulted? Not Applicable   Tommie SamsCook, Jaxn Chiquito G, DO 08/04/19 1525

## 2019-08-04 NOTE — ED Triage Notes (Signed)
Patient states that she has noticed bite like mark on her legs and arm. Patient states that this has been on going on for the last 2 weeks. States that areas are very itchy.

## 2019-11-03 ENCOUNTER — Ambulatory Visit
Admission: EM | Admit: 2019-11-03 | Discharge: 2019-11-03 | Disposition: A | Discharge status: Home / Self Care | Payer: Medicaid Other | Attending: Family Medicine | Admitting: Family Medicine

## 2019-11-03 ENCOUNTER — Encounter: Payer: Self-pay | Admitting: Emergency Medicine

## 2019-11-03 ENCOUNTER — Other Ambulatory Visit: Payer: Self-pay

## 2019-11-03 DIAGNOSIS — Z113 Encounter for screening for infections with a predominantly sexual mode of transmission: Secondary | ICD-10-CM | POA: Insufficient documentation

## 2019-11-03 DIAGNOSIS — N760 Acute vaginitis: Secondary | ICD-10-CM | POA: Diagnosis present

## 2019-11-03 DIAGNOSIS — A5901 Trichomonal vulvovaginitis: Secondary | ICD-10-CM | POA: Diagnosis present

## 2019-11-03 DIAGNOSIS — B9689 Other specified bacterial agents as the cause of diseases classified elsewhere: Secondary | ICD-10-CM | POA: Diagnosis not present

## 2019-11-03 LAB — WET PREP, GENITAL
Sperm: NONE SEEN
Yeast Wet Prep HPF POC: NONE SEEN

## 2019-11-03 MED ORDER — METRONIDAZOLE 500 MG PO TABS: 500.0000 mg | ORAL_TABLET | Freq: Two times a day (BID) | ORAL | 0 refills | Status: DC

## 2019-11-03 NOTE — ED Triage Notes (Signed)
Pt c/o vaginal discharge and odor. Discharge is white. Started about a week ago. She would like to be tested for STDs as well.

## 2019-11-03 NOTE — ED Provider Notes (Signed)
[facility] ____________________________________________  Time seen: Approximately 10:33 AM  I have reviewed the triage vital signs and the nursing notes.   HISTORY  Chief Complaint Vaginal Discharge   HPI Erica Glenn is a 23 y.o. female presenting for evaluation of 1 week of whitish vaginal discharge.  Denies odor.  Denies vaginal itching, sores or irritation.  Patient reports recent sexual partner change, and requests STD testing.  Requests gonorrhea, chlamydia, trichomonas testing, declines HIV or syphilis testing.  Denies pregnancy.  Denies abdominal pain, back pain, dysuria, fevers, recent cough or congestion.  Denies aggravating alleviating factors.  Reports otherwise normal.   No LMP recorded. (Menstrual status: IUD).  [facility] : PCP   Past Medical History:  Diagnosis Date  . Asthma   . H/O gestational diabetes in prior pregnancy, currently pregnant 2015   Scolosis    Patient Active Problem List   Diagnosis Date Noted  . Postpartum care following vaginal delivery 03/11/2016  . Proteinuria affecting pregnancy in third trimester 02/24/2016  . Asthma 02/24/2016    Past Surgical History:  Procedure Laterality Date  . NO PAST SURGERIES       No current facility-administered medications for this encounter.   Current Outpatient Medications:  .  levonorgestrel (MIRENA) 20 MCG/24HR IUD, 1 each by Intrauterine route once., Disp: , Rfl:  .  metroNIDAZOLE (FLAGYL) 500 MG tablet, Take 1 tablet (500 mg total) by mouth 2 (two) times daily., Disp: 14 tablet, Rfl: 0  Allergies Patient has no known allergies.  Family History  Problem Relation Age of Onset  . Healthy Mother     Social History Social History   Tobacco Use  . Smoking status: Never Smoker  . Smokeless tobacco: Never Used  Substance Use Topics  . Alcohol use: No  . Drug use: No    Review of Systems Constitutional: No fever ENT: No sore throat.  Cardiovascular: Denies chest pain. Respiratory: Denies shortness of breath. Gastrointestinal: No abdominal pain.  No nausea, no vomiting.  No diarrhea.   Genitourinary: Negative for dysuria. As above.  Musculoskeletal: Negative for back pain. Skin: Negative for rash.   ____________________________________________   PHYSICAL EXAM:  VITAL SIGNS: ED Triage Vitals  Enc Vitals Group     BP 11/03/19 0924 123/77     Pulse Rate 11/03/19 0924 80     Resp 11/03/19 0924 18     Temp 11/03/19 0924 98.5 F (36.9 C)     Temp Source 11/03/19 0924 Oral     SpO2 11/03/19 0924 97 %     Weight 11/03/19 0921 160 lb (72.6 kg)     Height 11/03/19 0921 4\' 11"  (1.499 m)     Head Circumference --      Peak Flow --      Pain Score 11/03/19 0921 0     Pain Loc --      Pain Edu? --      Excl. in [place]? --     Constitutional: Alert and oriented. Well appearing and in no acute distress. Eyes: Conjunctivae are normal. ENT      Head: Normocephalic and atraumatic. Cardiovascular: Normal rate, regular rhythm. Grossly normal heart sounds.  Good peripheral circulation. Respiratory: Normal respiratory effort without tachypnea nor retractions. Breath sounds are clear and equal bilaterally. No wheezes, rales, rhonchi. Gastrointestinal: Soft and nontender. No CVA tenderness. Musculoskeletal:  Steady gait.  No lumbar or flank tenderness. Neurologic:  Normal speech and language. Speech is normal. No gait instability.  Skin:  Skin is warm, dry and intact. No rash noted. Psychiatric: Mood and affect are normal. Speech and behavior are normal. Patient exhibits appropriate insight and judgment   ___________________________________________   LABS (all labs ordered are listed, but only abnormal results are displayed)  Labs Reviewed  WET PREP, GENITAL - Abnormal; Notable for the following components:      Result Value   Trich, Wet Prep PRESENT (*)    Clue Cells Wet Prep HPF POC PRESENT (*)    WBC, Wet Prep HPF  POC MANY (*)    All other components within normal limits  GC/CHLAMYDIA PROBE AMP    PROCEDURES Procedures    INITIAL IMPRESSION / ASSESSMENT AND PLAN / ED COURSE  Pertinent labs & imaging results that were available during my care of the patient were reviewed by me and considered in my medical decision making (see chart for details).  Well-appearing patient.  Patient elected for self wet prep and self GC chlamydia.  Declined other STD testing.  Wet prep positive for trichomonas and bacterial vaginosis.  Will treat with oral Flagyl.  Pelvic rest, no sex activity for at least 7 days.  Also discussed to inform partner his partner needs to be treated as well.  Follow-up with primary care.Discussed indication, risks and benefits of medications with patient.   Discussed follow up with Primary care physician this week. Discussed follow up and return parameters including no resolution or any worsening concerns. Patient verbalized understanding and agreed to plan.   ____________________________________________   FINAL CLINICAL IMPRESSION(S) / ED DIAGNOSES  Final diagnoses:  Bacterial vaginosis  Trichomonas vaginitis  Screen for STD (sexually transmitted disease)     ED Discharge Orders         Ordered    metroNIDAZOLE (FLAGYL) 500 MG tablet  2 times daily     11/03/19 1007           Note: This dictation was prepared with Dragon dictation along with smaller phrase technology. Any transcriptional errors that result from this process are unintentional.         Marylene Land, NP 11/03/19 1037

## 2019-11-03 NOTE — Discharge Instructions (Addendum)
Take medication as prescribed. Rest. Drink plenty of fluids. No sexual activity for at least one week. Partner needs to be informed and treated.   Follow up with your primary care physician this week as needed. Return to Urgent care for new or worsening concerns.

## 2019-11-04 LAB — GC/CHLAMYDIA PROBE AMP
Chlamydia trachomatis, NAA: NEGATIVE
Neisseria Gonorrhoeae by PCR: POSITIVE — AB

## 2019-11-05 ENCOUNTER — Telehealth (HOSPITAL_COMMUNITY): Payer: Self-pay | Admitting: Emergency Medicine

## 2019-11-05 NOTE — Telephone Encounter (Signed)
Gonorrhea is positive.  Patient should return as soon as possible to the urgent care for treatment with IM rocephin 250mg  and po zithromax 1g. Patient will not need to see a provider unless there are new symptoms she would like evaluated. Pt needs education to refrain from sexual intercourse for now and for 7 days after treatment to give the medicine time to work. Sexual partners need to be notified and tested/treated. Condoms may reduce risk of reinfection. [facility] notified.   Patient contacted by phone and made aware of    results. Pt verbalized understanding and had all questions answered. She will return for treatment tomorrow.

## 2019-11-06 ENCOUNTER — Other Ambulatory Visit: Payer: Self-pay

## 2019-11-06 ENCOUNTER — Ambulatory Visit
Admission: EM | Admit: 2019-11-06 | Discharge: 2019-11-06 | Disposition: A | Discharge status: Home / Self Care | Payer: Medicaid Other | Attending: Urgent Care | Admitting: Urgent Care

## 2019-11-06 DIAGNOSIS — A549 Gonococcal infection, unspecified: Secondary | ICD-10-CM | POA: Diagnosis not present

## 2019-11-06 MED ORDER — AZITHROMYCIN 500 MG PO TABS
1000.0000 mg | ORAL_TABLET | Freq: Once | ORAL | Status: AC
  Administered 2019-11-06: 1000 mg via ORAL

## 2019-11-06 MED ORDER — CEFTRIAXONE SODIUM 250 MG IJ SOLR
250.0000 mg | Freq: Once | INTRAMUSCULAR | Status: AC
  Administered 2019-11-06: 250 mg via INTRAMUSCULAR

## 2019-11-06 NOTE — ED Triage Notes (Signed)
Patient is here for treatment of Gonorrhea, was advised to come back. Patient is to get Azithromycin 1 gram and 250mg  Rocephin.

## 2020-02-15 IMAGING — CR DG CHEST 2V
2 series · 2 of 2 positions shown · non-contrast
Comparison: None.

CLINICAL DATA: 23-year-old female with a history of chest pain

EXAM:
CHEST - 2 VIEW

[chest pa]
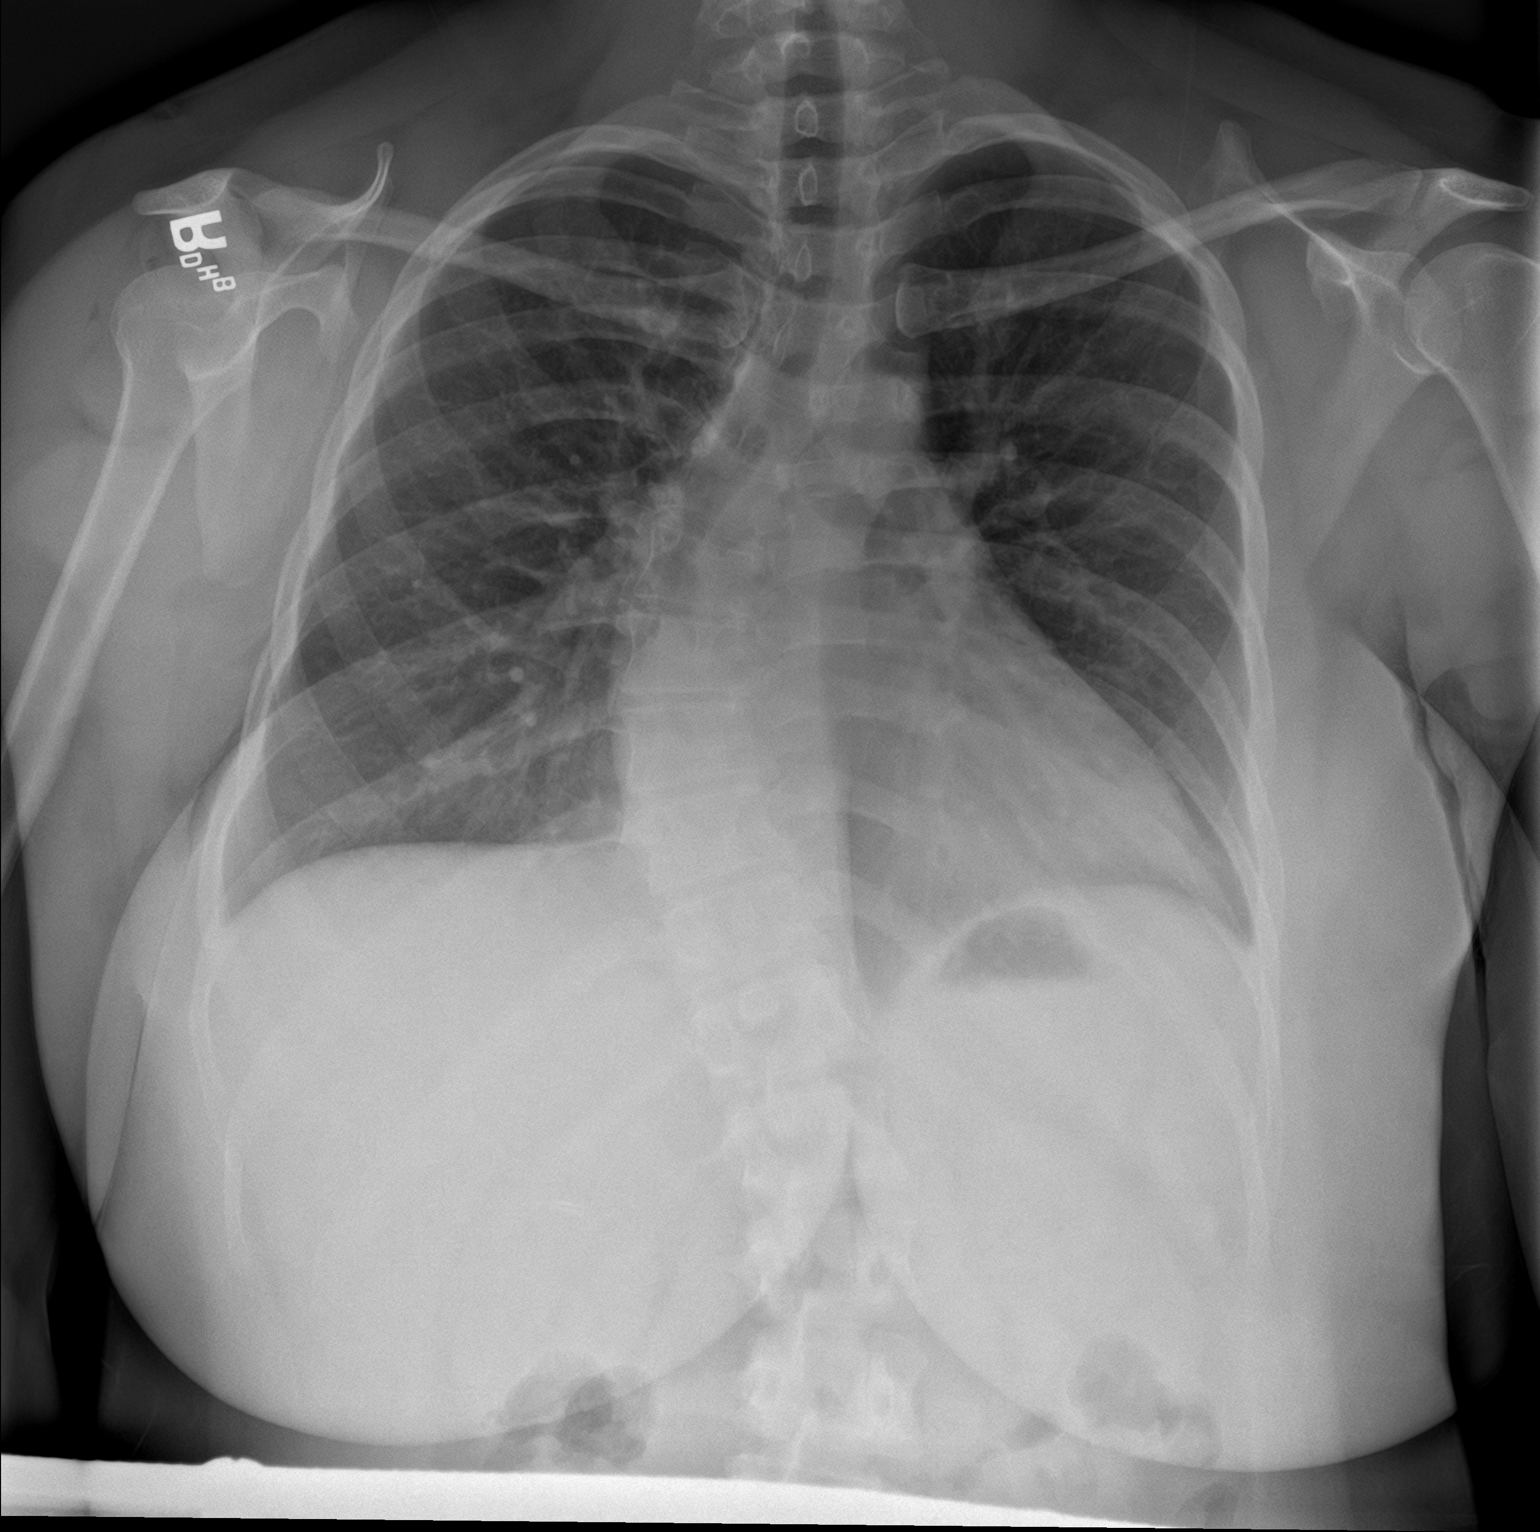

[chest lat]
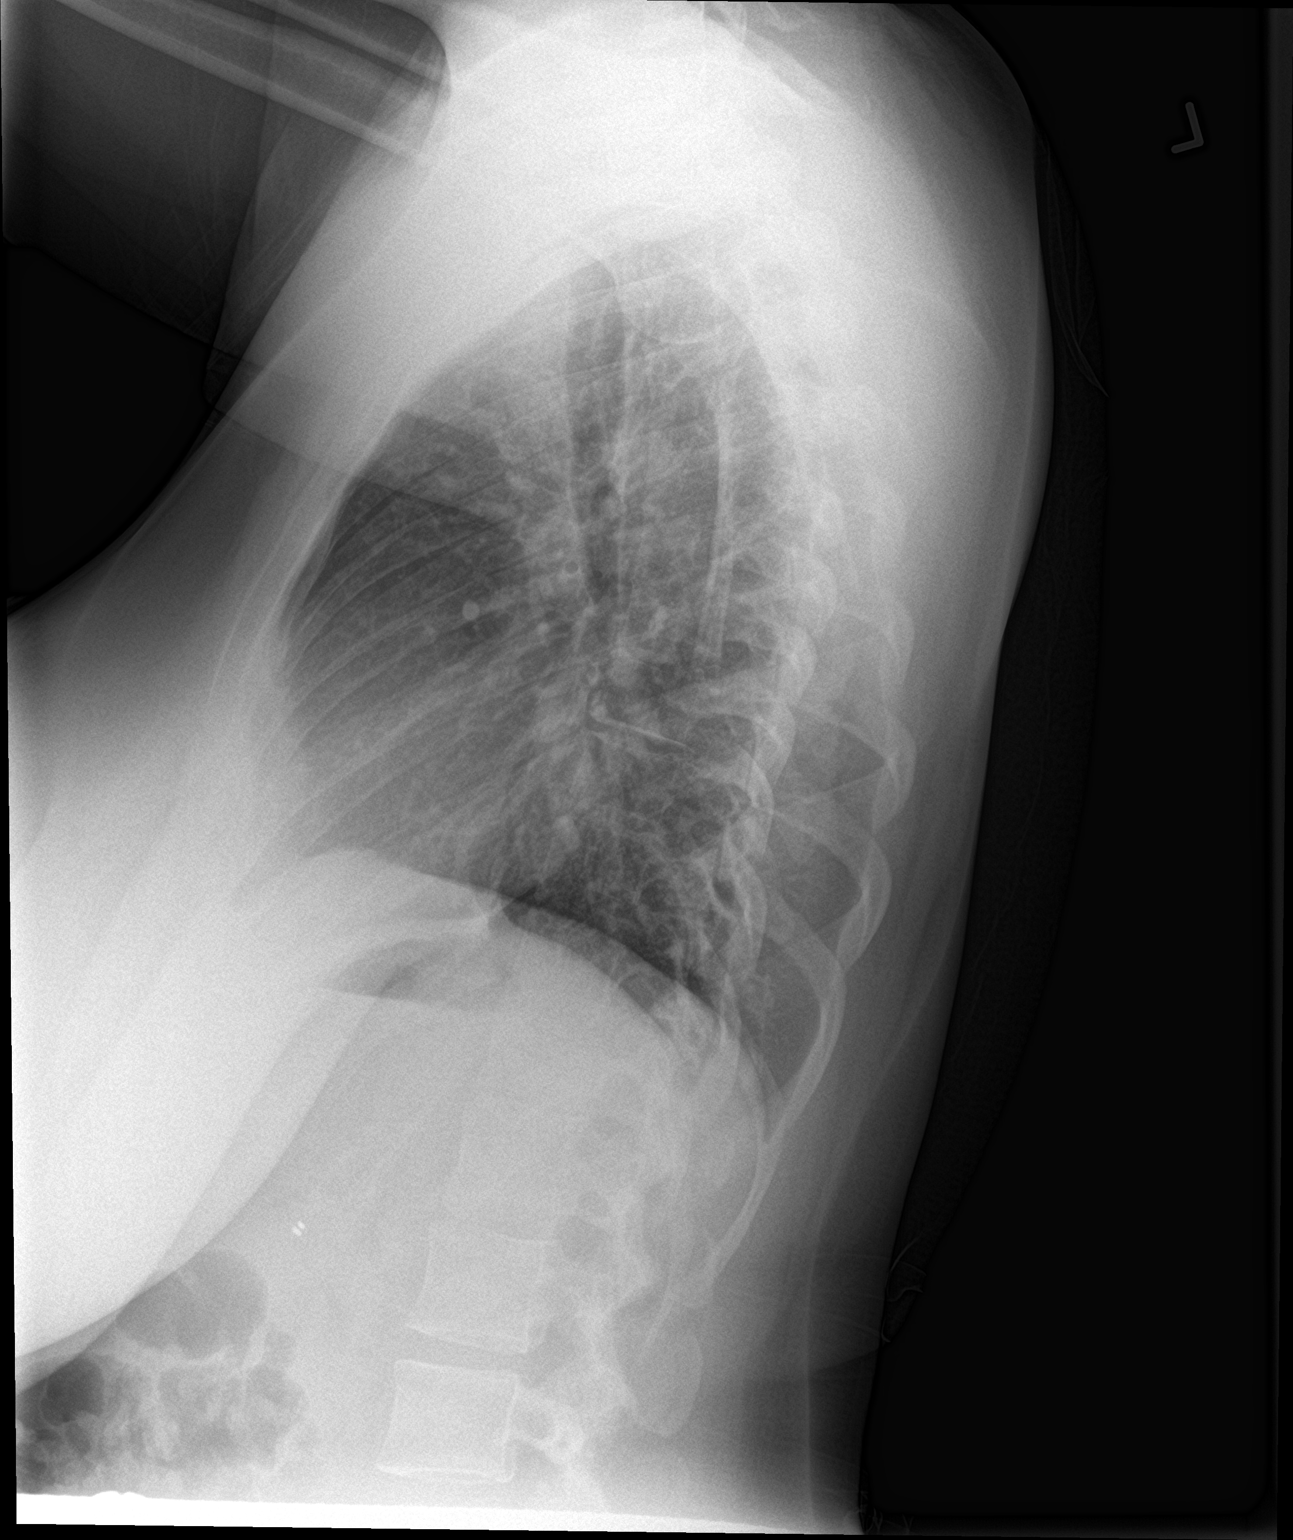

[2 of 2 positions shown; findings below may reference images not displayed]

FINDINGS: Cardiomediastinal silhouette within normal limits.

No pneumothorax. No pleural effusion. Scoliotic curvature of the
thoracic spine, apex right midthoracic spine.

No confluent airspace disease.

No displaced fracture.
IMPRESSION: Negative for acute cardiopulmonary disease.

Significant scoliotic curvature of the thoracic spine.

## 2020-05-30 ENCOUNTER — Ambulatory Visit: Payer: Self-pay

## 2020-10-04 ENCOUNTER — Ambulatory Visit: Payer: Medicaid Other | Admitting: Dietician

## 2020-10-13 ENCOUNTER — Encounter: Payer: Medicaid Other | Attending: Nurse Practitioner | Admitting: Dietician

## 2020-10-13 ENCOUNTER — Other Ambulatory Visit: Payer: Self-pay

## 2020-10-13 ENCOUNTER — Encounter: Payer: Self-pay | Admitting: Dietician

## 2020-10-13 VITALS — Ht 60.0 in | Wt 177.7 lb

## 2020-10-13 DIAGNOSIS — R7303 Prediabetes: Secondary | ICD-10-CM | POA: Insufficient documentation

## 2020-10-13 DIAGNOSIS — Z713 Dietary counseling and surveillance: Secondary | ICD-10-CM | POA: Insufficient documentation

## 2020-10-13 NOTE — Progress Notes (Signed)
Medical Nutrition Therapy: Visit start time: 1330  end time: 1445 Assessment:  Diagnosis: prediabetes Past medical history: cholecystectomy, gestational DM Psychosocial issues/ stress concerns: pt rates stress level as 'low' and feels 'very well' about stress management skills  Preferred learning method:   Auditory  Visual  Hands-on  Current weight: 177.7 lbs  Height: 5' Medications, supplements: reconciled in medic al record  Labs HgA1c 5.6(H) on 08/24/2020  Progress and evaluation:   Pt recently diagnosed with prediabetes in September 2021  Pt reports she had gestational diabetes with her first child about 6 years ago and had nutrition education about carb counting and food groups   Pt reports main change she has made to her diet since diagnosis is less sweet tea and more water   Pt reports she does not currently exercise and recognized time slots in her day that she could go for a walk   States she used to walk 15-20 min a day several times a week sometime before she enrolled in school   Pt lives with her two children, ages 15 and 19; enrolled in college classes full time   Physical activity: ADLs  Dietary Intake:  Usual eating pattern includes 2-3 meals and 1-2 snacks per day. Dining out frequency: 3 meals per week.  Breakfast: skips; reese's puffs/apple jacks cereal with 1% milk; sausage with eggs  Lunch: subway sandwich 6" on flatbread/italian herb teriyaki  Chicken lettuce and tomato with chips; fried bologna, mayo, must on bread sometimes with chips  Snack: potato chips, cheetos, hot fries  Supper: spaghetti, corn on cobb; tenders with broccoli and cheese; baked chicken with green beans and rice  Beverages: sweet tea, orange juice, 2-3 bottles water, diet coke     Nutrition Care Education: Basic nutrition: basic food groups, appropriate nutrient balance, appropriate meal and snack schedule, general nutrition guidelines    Advanced nutrition:  cooking techniques, food  label reading Prediabetes:  appropriate meal and snack schedule, appropriate carb intake and balance, healthy carb choices, role of fiber, protein, fat Other: healthy and unhealthy fats, role of exercise  Nutritional Diagnosis:  -2.2 Altered nutrition-related laboratory As related to new diagnosis of prediabetes.  As evidenced by HgA1c of 5.6 on 08/24/2020.  Intervention:  Discussion and instruction as noted above.  Additionally, answered questions about what foods in diet may be causing GI upset as a result of cholecystectomy.  Established goals for additional change.   Increase fruit and vegetable intake   Incorporate vegetables at lunch and dinner   Incorporate more F/V at snacks (carrots/celery with ranch)  Try frozen veggies that come in microwaveable pouches (may be tender than fresh and low prep time)  Try canned fruit NOT in heavy syrup (mandarin oranges, peaches, pears, applesauce) for snacks + protein (cheese, sunbutter)   Decrease sugar-sweetened beverage consumption   Switch from sweet to unsweet tea with sugar substitute; or start with half/half tea   Drink at least 64oz water daily (add crystal light, lemon, or mio as needed)    Increase physical activity   Incrementally reintroduce exercise back into routine   150 minutes per week is recommendation   Start with 10 min walks around the neighborhood   Increase fiber intake   Eat at least 3 servings of whole grains a day  Increase fruit and vegetable intake   Decrease saturated fat intake   Limit processed meats and fried foods   Switch to low fat dairy products   Education Materials given:   General  diet guidelines for Diabetes  Plate Planner with food lists   Recipes- [email or web address], [email or web address]  Goals/ instructions  Learner/ who was taught:   Patient   Level of understanding:  Verbalizes/ demonstrates competency  Demonstrated degree of understanding via:   Teach back Learning  barriers:  None  Willingness to learn/ readiness for change:  Eager, change in progress  Monitoring and Evaluation:  Dietary intake, exercise, HgA1c, and body weight      follow up: December 15 at 1:30pm

## 2020-10-13 NOTE — Patient Instructions (Signed)
·   Plan snacks mindfully  Pair a carb (fruit, crackers, popcorn) with a protein (sunflower butter, cheese, [place])  Try walking for 10 min, 1-2 a week   Keep up the great water intake!

## 2020-11-10 ENCOUNTER — Telehealth: Payer: Self-pay | Admitting: Dietician

## 2020-11-10 ENCOUNTER — Ambulatory Visit: Payer: Medicaid Other | Admitting: Dietician

## 2020-11-10 NOTE — Telephone Encounter (Signed)
Pt did not show or call to cancel follow up appointment today at 1:30p.  Left message for pt instructing her to call our office if she is interested in rescheduling the visit.

## 2020-11-30 ENCOUNTER — Encounter: Payer: Self-pay | Admitting: Plastic Surgery

## 2020-11-30 ENCOUNTER — Other Ambulatory Visit: Payer: Self-pay

## 2020-11-30 ENCOUNTER — Ambulatory Visit (INDEPENDENT_AMBULATORY_CARE_PROVIDER_SITE_OTHER): Payer: Medicaid Other | Admitting: Plastic Surgery

## 2020-11-30 DIAGNOSIS — N62 Hypertrophy of breast: Secondary | ICD-10-CM | POA: Diagnosis not present

## 2020-11-30 DIAGNOSIS — M542 Cervicalgia: Secondary | ICD-10-CM

## 2020-11-30 DIAGNOSIS — G8929 Other chronic pain: Secondary | ICD-10-CM

## 2020-11-30 DIAGNOSIS — M546 Pain in thoracic spine: Secondary | ICD-10-CM

## 2020-11-30 DIAGNOSIS — M549 Dorsalgia, unspecified: Secondary | ICD-10-CM | POA: Insufficient documentation

## 2020-11-30 NOTE — Progress Notes (Signed)
Patient ID: Erica Glenn, female    DOB: [birth year], 25 y.o.   MRN: [id number]   Chief Complaint  Patient presents with  . Advice Only    Mammary Hyperplasia: The patient is a 25 y.o. female with a history of mammary hyperplasia for several years.  She has extremely large breasts causing symptoms that include the following: Back pain in the upper and lower back, including neck pain. She pulls or pins her bra straps to provide better lift and relief of the pressure and pain. She notices relief by holding her breast up manually.  Her shoulder straps cause grooves and pain and pressure that requires padding for relief. Pain medication is sometimes required with motrin and tylenol.  Activities that are hindered by enlarged breasts include: exercise and running.  She has tried supportive clothing as well as fitted bras without improvement.  Her breasts are extremely large and fairly symmetric.  She has hyperpigmentation of the inframammary area on both sides.  The sternal to nipple distance on the right is 35 cm and the left is 36 cm.  The IMF distance is 22 cm.  She is 5 feet 0 inches tall and weighs 178 pounds.  Preoperative bra size = 38 G cup. Would like to be a B/C cup.  The estimated excess breast tissue to be removed at the time of surgery = 550 grams on the left and 550 grams on the right.  Mammogram history: none.  Family history of breast cancer:  Maternal grandmother. Mom had a breast reduction. The patient has an identical twin who also wants a reduction  Tobacco use:  None.  He had physical therapy about 3 years ago and it did not help her neck or back pain.  She is willing to repeat it.  She is also seeing a nutritionist to try and reduce her overall weight.    Review of Systems  Constitutional: Negative.   HENT: Negative.   Eyes: Negative.   Respiratory: Negative.   Cardiovascular: Negative.   Gastrointestinal: Negative.   Endocrine: Negative.   Genitourinary: Negative.    Musculoskeletal: Negative.   Neurological: Negative.   Hematological: Negative.     Past Medical History:  Diagnosis Date  . Asthma   . H/O gestational diabetes in prior pregnancy, currently pregnant 2015   Scolosis    Past Surgical History:  Procedure Laterality Date  . NO PAST SURGERIES        Current Outpatient Medications:  .  cetirizine (ZYRTEC) 10 MG tablet, Take 10 mg by mouth daily., Disp: , Rfl:  .  doxycycline (VIBRA-TABS) 100 MG tablet, Take 100 mg by mouth 2 (two) times daily., Disp: , Rfl:  .  levonorgestrel (MIRENA) 20 MCG/24HR IUD, 1 each by Intrauterine route once., Disp: , Rfl:  .  metroNIDAZOLE (FLAGYL) 500 MG tablet, Take 1 tablet (500 mg total) by mouth 2 (two) times daily., Disp: 14 tablet, Rfl: 0 .  PROAIR HFA 108 (90 Base) MCG/ACT inhaler, Inhale into the lungs., Disp: , Rfl:    Objective:   There were no vitals filed for this visit.  Physical Exam Constitutional:      Appearance: Normal appearance.  Cardiovascular:     Rate and Rhythm: Normal rate.     Pulses: Normal pulses.  Pulmonary:     Effort: Pulmonary effort is normal.  Abdominal:     General: Abdomen is flat. There is no distension.  Skin:    General: Skin is warm.  Neurological:     General: No focal deficit present.     Mental Status: She is alert and oriented to person, place, and time.  Psychiatric:        Mood and Affect: Mood normal.        Behavior: Behavior normal.        Thought Content: Thought content normal.     Assessment & Plan:  Symptomatic mammary hypertrophy  Chronic bilateral thoracic back pain  Neck pain  The patient is a very good candidate for bilateral breast reduction with possible liposuction.  We will refer her to physical therapy for no other session.  She knows to give [place] a call when she has completed the treatment plan.  Pictures were obtained of the patient and placed in the chart with the patient's or guardian's permission.   Erica Glenn  Erica Halliday, DO

## 2020-12-07 ENCOUNTER — Ambulatory Visit: Payer: Medicaid Other | Attending: Plastic Surgery | Admitting: Physical Therapy

## 2020-12-07 ENCOUNTER — Other Ambulatory Visit: Payer: Self-pay

## 2020-12-07 ENCOUNTER — Encounter: Payer: Self-pay | Admitting: Physical Therapy

## 2020-12-07 DIAGNOSIS — M546 Pain in thoracic spine: Secondary | ICD-10-CM | POA: Insufficient documentation

## 2020-12-07 DIAGNOSIS — R293 Abnormal posture: Secondary | ICD-10-CM | POA: Insufficient documentation

## 2020-12-07 NOTE — Therapy (Incomplete)
[facility] [address]. [place], [place], [address] Phone: [phone]   Fax:  [phone]  Physical Therapy Evaluation  Patient Details  Name: Erica Glenn MRN: [id number] Date of Birth: Sep 23, 1996 Referring Provider (PT): Foster Simpson   Encounter Date: 12/07/2020   PT End of Session - 12/07/20 1427    Visit Number 1    Number of Visits 9    Date for PT Re-Evaluation 01/04/21    PT Start Time 1400    PT Stop Time 1455    PT Time Calculation (min) 55 min    Activity Tolerance Patient tolerated treatment well    Behavior During Therapy [facility] for tasks assessed/performed           Past Medical History:  Diagnosis Date  . Asthma   . H/O gestational diabetes in prior pregnancy, currently pregnant 2015   Scolosis    Past Surgical History:  Procedure Laterality Date  . CHOLECYSTECTOMY  2017  . NO PAST SURGERIES      There were no vitals filed for this visit.    Subjective Assessment - 12/07/20 1413    Subjective Patient presents to PT because of scoliosis and large breasts causing back pain. She notes she canno tsustain any activity for a long time because of discomfort/pain. Patient notes difficulty playing with kids outside as well as headaches. All of which are limiting her ability to participate in fitness activities as well as community activity and patient notes a sense of dissatisfaction and limited social participation because of her breast size. Patient notes best pain score 5/10; worst pain in past week 8-9/10.    Pertinent History Patient notes longstanding history of pain. Patient had PT previously for scoliosis (S curve) and continues to apply some postural awareness principles.    Limitations Lifting;Standing;Walking;House hold activities    How long can you sit comfortably? w/ back support 90 min; w/o back support 15 min    How long can you stand comfortably? 15-30 min    How long can you walk  comfortably? 15-30 min    Currently in Pain? Yes    Pain Score 5     Pain Location Thoracic    Pain Descriptors / Indicators Heaviness;Aching    Pain Type Chronic pain    Pain Onset More than a month ago    Pain Frequency Constant    Aggravating Factors  upright, unsupported postures for > 15 min, carrying groceries, laundry    Pain Relieving Factors heating pad, supported position    Effect of Pain on Daily Activities 30% function              [facility] PT Assessment - 12/07/20 0001      Assessment   Medical Diagnosis Symptomatic mammary hypertrophy, chronic bilateral thoracic back pain    Referring Provider (PT) Dillingham, Claire    Hand Dominance Right    Prior Therapy Yes; for scoliosis      Balance Screen   Has the patient fallen in the past 6 months No           OBJECTIVE  Mental Status Patient is oriented to person, place and time.  Recent memory is intact.  Remote memory is intact.  Attention span and concentration are intact.  Expressive speech is intact.  Patient's fund of knowledge is within normal limits for educational level.  SENSATION: Grossly intact to light touch bilateral LEs as determined by testing dermatomes C2-C8 Proprioception and hot/cold  testing deferred on this date   MUSCULOSKELETAL: Tremor: None Bulk: Normal Tone: Normal  Posture Lumbar lordosis: increased  Thoracic kyphosis: increased R convexity in upper thoracic spine, L convexity in lower lumbar  Gait **Wide based gait = spinal stenosis (+LR 12)    Strength (out of 5) R/L 5/5 Shoulder flexion (anterior deltoid/pec major/coracobrachialis, axillary n. (C5/6) and musculocutaneous n. (C5-7)) 5/5 Shoulder abduction (deltoid/supraspinatus, axillary/suprascapular n, C5) 5/5 Shoulder external rotation (infraspinatus/teres minor) 5/5 Shoulder internal rotation (subcapularis/lats/pec major) 5/5 Shoulder extension (posterior deltoid, lats, teres major, axillary/thoracodorsal n.) 5/5  Shoulder horizontal abduction 5/5 Elbow flexion (biceps brachii, brachialis, brachioradialis, musculoskeletal n, C5/6) 5/5 Elbow extension (triceps, radial n, C7) 5/5 Wrist Extension (C6/7) 5/5 Wrist Flexion (C6/7) 5/5 Finger adduction (interossei, ulnar n, T1) Cervical isometrics are strong in all directions;   *Indicates pain   AROM (degrees) R/L (all movements include overpressure unless otherwise stated) Cervical flexion: WNL Cervical extension: WNL Cervical lateral flexion: R: WNL  L: WNL Cervical rotation:  B WNL Thoracic flexion: WNL Thoracic extension: WNL* Thoracic lateral flexion: R: WNL  L: WNL Thoracic rotation:  B WNL  Repeated Movements No centralization or peripheralization of symptoms with repeated lumbar extension or flexion.    Muscle Length Hamstrings: R: degrees L: degrees  [place]: Thomas:  Ober:    Passive Accessory Intervertebral Motion (PAIVM) Pt denies reproduction of back pain with CPA L1-L5 and UPA bilaterally L1-L5. Generally hypomobile throughout  Passive Physiological Intervertebral Motion (PPIVM) Normal flexion and extension with PPIVM testing   SPECIAL TESTS Lumbar Radiculopathy and Discogenic: Centralization and Peripheralization (SN 92, -LR 0.12): {NEGATIVE/POSITIVE GQQ:76195} Slump (SN 83, -LR 0.32): R: {NEGATIVE/POSITIVE KDT:26712} L: {NEGATIVE/POSITIVE FOR:19998} SLR (SN 92, -LR 0.29): R: {NEGATIVE/POSITIVE WPY:09983} L:  {NEGATIVE/POSITIVE JAS:50539} Crossed SLR (SP 90): R: {NEGATIVE/POSITIVE JQB:34193} L: {NEGATIVE/POSITIVE XTK:24097}  Facet Joint: Extension-Rotation (SN 100, -LR 0.0): R: {NEGATIVE/POSITIVE DZH:29924} L: {NEGATIVE/POSITIVE QAS:34196}  Lumbar Spinal Stenosis: Lumbar quadrant (SN 70): R: {NEGATIVE/POSITIVE QIW:97989} L: {NEGATIVE/POSITIVE QJJ:94174}  Hip: FABER (SN 81): R: {NEGATIVE/POSITIVE YCX:44818} L: {NEGATIVE/POSITIVE HUD:14970} FADIR (SN 94): R: {NEGATIVE/POSITIVE YOV:78588} L: {NEGATIVE/POSITIVE  FOY:77412} Hip scour (SN 50): R: {NEGATIVE/POSITIVE INO:67672} L: {NEGATIVE/POSITIVE CNO:70962}  SIJ:  Thigh Thrust (SN 88, -LR 0.18) : R: {NEGATIVE/POSITIVE EZM:62947} L: {NEGATIVE/POSITIVE MLY:65035}  Piriformis Syndrome: FAIR Test (SN 88, SP 83): R: {NEGATIVE/POSITIVE WSF:68127} L: {NEGATIVE/POSITIVE NTZ:00174}  Functional Tasks Sit to stand:   ASSESSMENT Patient is a *** year old presenting to clinic with chief complaints of ***. Upon examination, patient demonstrates deficits in *** as evidenced by ***. Patient's responses on *** outcome measures (***) indicate *** functional limitations/disability/distress. Patient's progress may be limited due to ***; however, patient's *** is advantageous. Patient was able to achieve *** during today's evaluation and responded positively to *** interventions. Patient will benefit from continued skilled therapeutic intervention to address deficits in *** in order to ***, increase function, and improve overall QOL.    Pt will be independent with HEP in order to improve strength and decrease back pain in order to improve pain-free function at home and work.   Pt will decrease mODI score by at least 13 points in order demonstrate clinically significant reduction in back pain/disability.        Pt will decrease Roland-Morris Disability Questionnaire (RMDQ) by at least 5 points in order to demonstrate a clinically significant reduction in back pain/disability  Pt will decrease worst back pain as reported on NPRS by at least 2 points in order to demonstrate clinically significant reduction in back pain.  Pt will increase strength of by at least 1/2 MMT grade in order to demonstrate improvement in strength and function.       Objective measurements completed on examination: See above findings.                            Plan - 12/07/20 1428    Personal Factors and Comorbidities Age;Behavior Pattern;Comorbidity 1;Past/Current  Experience;Time since onset of injury/illness/exacerbation    Examination-Activity Limitations [place];Caring for Others;Carry;Lift;Stand;Sit    Examination-Participation Restrictions Meal Prep;Cleaning;Yard Work;Community Activity;Shop;Laundry;Occupation    Stability/Clinical Decision Making Evolving/Moderate complexity    Clinical Decision Making Moderate    Rehab Potential Fair    PT Frequency 2x / week    PT Duration 4 weeks    PT Treatment/Interventions Patient/family education;Taping;Joint Manipulations;Spinal Manipulations;Dry needling;Manual techniques;Moist Heat;Neuromuscular re-education;Therapeutic exercise;Therapeutic activities;ADLs/Self Care Home Management;Aquatic Therapy;Electrical Stimulation    Consulted and Agree with Plan of Care Patient           Patient will benefit from skilled therapeutic intervention in order to improve the following deficits and impairments:  Pain,Improper body mechanics,Postural dysfunction,Decreased strength,Decreased activity tolerance  Visit Diagnosis: Abnormal posture  Pain in thoracic spine     Problem List Patient Active Problem List   Diagnosis Date Noted  . Symptomatic mammary hypertrophy 11/30/2020  . Back pain 11/30/2020  . Neck pain 11/30/2020  . Postpartum care following vaginal delivery 03/11/2016  . Proteinuria affecting pregnancy in third trimester 02/24/2016  . Asthma 02/24/2016    Kathryne Eriksson 12/07/2020, 2:33 PM  [facility] [address] [place], [place], [address] Phone: [phone]   Fax:  [phone]  Name: Erica Glenn MRN: [id number] Date of Birth: [birth year]

## 2020-12-08 NOTE — Therapy (Signed)
[facility] [address] [place], [place], [address] Phone: [phone]   Fax:  [phone]  Physical Therapy Evaluation  Patient Details  Name: Erica Glenn MRN: [id number] Date of Birth: [birth year] Referring Provider (PT): Foster Simpson   Encounter Date: 12/07/2020   PT End of Session - 12/07/20 1427    Visit Number 1    Number of Visits 9    Date for PT Re-Evaluation 01/04/21    PT Start Time 1400    PT Stop Time 1455    PT Time Calculation (min) 55 min    Activity Tolerance Patient tolerated treatment well    Behavior During Therapy [facility] for tasks assessed/performed           Past Medical History:  Diagnosis Date  . Asthma   . H/O gestational diabetes in prior pregnancy, currently pregnant 2015   Scolosis    Past Surgical History:  Procedure Laterality Date  . CHOLECYSTECTOMY  2017  . NO PAST SURGERIES      There were no vitals filed for this visit.    Subjective Assessment - 12/07/20 1413    Subjective Patient presents to PT because of scoliosis and large breasts causing back pain. She notes she canno tsustain any activity for a long time because of discomfort/pain. Patient notes difficulty playing with kids outside as well as headaches. All of which are limiting her ability to participate in fitness activities as well as community activity and patient notes a sense of dissatisfaction and limited social participation because of her breast size. Patient notes best pain score 5/10; worst pain in past week 8-9/10. Patient does not report any numbness, profound weakness, vision changes, dizziness, nor nausea.    Pertinent History Patient notes longstanding history of pain. Patient had PT previously for scoliosis (S curve) and continues to apply some postural awareness principles.    Limitations Lifting;Standing;Walking;House hold activities    How long can you sit comfortably? w/ back support 90 min; w/o back  support 15 min    How long can you stand comfortably? 15-30 min    How long can you walk comfortably? 15-30 min    Currently in Pain? Yes    Pain Score 5     Pain Location Thoracic    Pain Descriptors / Indicators Heaviness;Aching    Pain Type Chronic pain    Pain Onset More than a month ago    Pain Frequency Constant    Aggravating Factors  upright, unsupported postures for > 15 min, carrying groceries, laundry    Pain Relieving Factors heating pad, supported position    Effect of Pain on Daily Activities 30% function               OBJECTIVE  Mental Status Patient is oriented to person, place and time.  Recent memory is intact.  Remote memory is intact.  Attention span and concentration are intact.  Expressive speech is intact.  Patient's fund of knowledge is within normal limits for educational level.  SENSATION: Grossly intact to light touch bilateral LEs as determined by testing dermatomes C2-C8 Proprioception and hot/cold testing deferred on this date   MUSCULOSKELETAL: Tremor: None Bulk: Normal Tone: Normal  Posture Lumbar lordosis: increased  Thoracic kyphosis: increased R convexity in upper thoracic spine, L convexity in lower lumbar  Gait WNL    Strength (out of 5) R/L 5/5 Shoulder flexion (anterior deltoid/pec major/coracobrachialis, axillary n. (C5/6) and musculocutaneous n. (C5-7)) 5/5 Shoulder abduction (  deltoid/supraspinatus, axillary/suprascapular n, C5) 5/5 Shoulder external rotation (infraspinatus/teres minor) 5/5 Shoulder internal rotation (subcapularis/lats/pec major) 5/5 Shoulder extension (posterior deltoid, lats, teres major, axillary/thoracodorsal n.) 5/5 Shoulder horizontal abduction 5/5 Elbow flexion (biceps brachii, brachialis, brachioradialis, musculoskeletal n, C5/6) 5/5 Elbow extension (triceps, radial n, C7) 5/5 Wrist Extension (C6/7) 5/5 Wrist Flexion (C6/7) 5/5 Finger adduction (interossei, ulnar n, T1) Cervical isometrics  are strong in all directions;   *Indicates pain   AROM (degrees) R/L (all movements include overpressure unless otherwise stated) Cervical flexion: WNL Cervical extension: WNL Cervical lateral flexion: R: WNL  L: WNL Cervical rotation:  B WNL Thoracic flexion: WNL Thoracic extension: WNL* Thoracic lateral flexion: R: WNL  L: WNL Thoracic rotation:  B WNL  Repeated Movements Repeated thoracic extension mildly relieving.   Muscle Length Upper trapezius: WNL   SPECIAL TESTS Not indicated   ASSESSMENT Patient is a 25 year old presenting to clinic with chief complaints of thoracic pain, headaches, and postural fatigue as a result of mammary hypertrophy. Upon examination, patient demonstrates deficits in posture, pain free thoracic ROM, and function as evidenced by self-report of 30% function, painful thoracic extension, inability to tolerate gravity dependent position > 15 minutes without increase in pain, worst pain 8-9/10 in thoracic spine, R convexity in upper thoracic spine, L convexity in lower lumbar. Patient's responses on FOTO outcome measures (49) indicate significant functional limitations/disability/distress. Patient's progress may be limited due to body habitus; physical therapy interventions will likely be most useful for pain modulation and postural strengthening. However, the likelihood of physical therapy completely relieving the complaint and restoring function to a level > 60% is unlikely considering the significance of patient's mammary hypertrophy. Patient was educated on benefits of physical therapy and her motivation is advantageous. Patient will benefit from continued skilled therapeutic intervention to address deficits in posture, pain free thoracic ROM, and function in order to manage pain, increase function, and improve overall QOL.    EDUCATION Patient educated on prognosis, POC, and provided with HEP including: doorway stretch with scoliosis oppositional  positioning. Patient articulated understanding and returned demonstration. Patient will benefit from further education in order to maximize compliance and understanding for long-term therapeutic gains.        Objective measurements completed on examination: See above findings.          PT Long Term Goals - 12/08/20 3474      PT LONG TERM GOAL #1   Title Patient will be independent with HEP in order to improve strength and decrease back pain in order to improve pain-free function at home and in the community.    Baseline IE: provided    Time 4    Period Weeks    Status New    Target Date 01/04/21      PT LONG TERM GOAL #2   Title Patient will demonstrate improved function as evidenced by a score of 59 on FOTO measure for full participation in activities at home and in the community.    Baseline IE: 49    Time 4    Period Weeks    Status New    Target Date 01/04/21      PT LONG TERM GOAL #3   Title Patient will decrease worst back pain as reported on NPRS by at least 2 points in order to demonstrate clinically significant reduction in back pain.    Baseline IE: 8-9/10    Time 4    Period Weeks    Status New  Target Date 01/04/21      PT LONG TERM GOAL #4   Title Patient will increase postural endurance/strength as evidenced by ability to tolerate standing posture > 30 min without increase in pain/headache in order to demonstrate improvement in strength and function.    Baseline IE: <15 min    Time 4    Period Weeks    Status New    Target Date 01/04/21                  Plan - 12/07/20 1428    Clinical Impression Statement Patient is a 25 year old presenting to clinic with chief complaints of thoracic pain, headaches, and postural fatigue as a result of mammary hypertrophy. Upon examination, patient demonstrates deficits in posture, pain free thoracic ROM, and function as evidenced by self-report of 30% function, painful thoracic extension, inability to  tolerate gravity dependent position > 15 minutes without increase in pain, worst pain 8-9/10 in thoracic spine, R convexity in upper thoracic spine, L convexity in lower lumbar. Patient's responses on FOTO outcome measures (49) indicate significant functional limitations/disability/distress. Patient's progress may be limited due to body habitus; physical therapy interventions will likely be most useful for pain modulation and postural strengthening. However, the likelihood of physical therapy completely relieving the complaint and restoring function to a level > 60% is unlikely considering the significance of patient's mammary hypertrophy. Patient was educated on benefits of physical therapy and her motivation is advantageous. Patient will benefit from continued skilled therapeutic intervention to address deficits in posture, pain free thoracic ROM, and function in order to manage pain, increase function, and improve overall QOL.    Personal Factors and Comorbidities Age;Behavior Pattern;Comorbidity 1;Past/Current Experience;Time since onset of injury/illness/exacerbation    Comorbidities asthma    Examination-Activity Limitations Bend;Caring for Others;Carry;Lift;Stand;Sit    Examination-Participation Restrictions Meal Prep;Cleaning;Yard Work;Community Activity;Shop;Laundry;Occupation    Stability/Clinical Decision Making Evolving/Moderate complexity    Clinical Decision Making Moderate    Rehab Potential Poor    PT Frequency 2x / week    PT Duration 4 weeks    PT Treatment/Interventions Patient/family education;Taping;Joint Manipulations;Spinal Manipulations;Dry needling;Manual techniques;Moist Heat;Neuromuscular re-education;Therapeutic exercise;Therapeutic activities;ADLs/Self Care Home Management;Aquatic Therapy;Electrical Stimulation    PT Next Visit Plan manual as needed, postural strengthening    PT Home Exercise Plan doorway stretch    Consulted and Agree with Plan of Care Patient            Patient will benefit from skilled therapeutic intervention in order to improve the following deficits and impairments:  Pain,Improper body mechanics,Postural dysfunction,Decreased strength,Decreased activity tolerance,Decreased endurance  Visit Diagnosis: Abnormal posture - Plan: PT plan of care cert/re-cert  Pain in thoracic spine - Plan: PT plan of care cert/re-cert     Problem List Patient Active Problem List   Diagnosis Date Noted  . Symptomatic mammary hypertrophy 11/30/2020  . Back pain 11/30/2020  . Neck pain 11/30/2020  . Postpartum care following vaginal delivery 03/11/2016  . Proteinuria affecting pregnancy in third trimester 02/24/2016  . Asthma 02/24/2016   Sheria Lang PT, DPT [phone]  12/08/2020, 8:39 AM  [facility] [address] [place], [place], [address] Phone: [phone]   Fax:  [phone]  Name: Erica Glenn MRN: [id number] Date of Birth: 1996-03-19

## 2020-12-09 ENCOUNTER — Encounter: Payer: Self-pay | Admitting: Physical Therapy

## 2020-12-14 ENCOUNTER — Encounter: Payer: Self-pay | Admitting: Physical Therapy

## 2020-12-14 ENCOUNTER — Ambulatory Visit: Payer: Medicaid Other | Admitting: Physical Therapy

## 2020-12-14 ENCOUNTER — Other Ambulatory Visit: Payer: Self-pay

## 2020-12-14 DIAGNOSIS — R293 Abnormal posture: Secondary | ICD-10-CM

## 2020-12-14 DIAGNOSIS — M546 Pain in thoracic spine: Secondary | ICD-10-CM

## 2020-12-14 NOTE — Therapy (Signed)
[facility] [address] [place], [place], [address] Phone: [phone]   Fax:  [phone]  Physical Therapy Treatment  Patient Details  Name: Erica Glenn MRN: [id number] Date of Birth: 07/18/1996 Referring Provider (PT): Foster Simpson   Encounter Date: 12/14/2020   PT End of Session - 12/14/20 1504    Visit Number 2    Number of Visits 9    Date for PT Re-Evaluation 01/04/21    PT Start Time 1500    PT Stop Time 1555    PT Time Calculation (min) 55 min    Activity Tolerance Patient tolerated treatment well    Behavior During Therapy [facility] for tasks assessed/performed           Past Medical History:  Diagnosis Date  . Asthma   . H/O gestational diabetes in prior pregnancy, currently pregnant 2015   Scolosis    Past Surgical History:  Procedure Laterality Date  . CHOLECYSTECTOMY  2017  . NO PAST SURGERIES      There were no vitals filed for this visit.   Subjective Assessment - 12/14/20 1502    Subjective Patient reports no significant changes since last session. She notes that the STM from initial evaluation gave her relief for a good bit (until she really started moving around later in the day). Patient notes that she has been able to stretch in the doorway and finds it helpful.    Pertinent History Patient notes longstanding history of pain. Patient had PT previously for scoliosis (S curve) and continues to apply some postural awareness principles.    Limitations Lifting;Standing;Walking;House hold activities    How long can you sit comfortably? w/ back support 90 min; w/o back support 15 min    How long can you stand comfortably? 15-30 min    How long can you walk comfortably? 15-30 min    Currently in Pain? Yes    Pain Score 4     Pain Location Thoracic    Pain Onset More than a month ago          TREATMENT Manual Therapy: STM and TPR performed to thoracic and lumbar mm to allow for decreased  tension and pain and improved posture and function with vibratory and percussive instrument Thoracic CPA/UPA mobilizations for decreased spasm and improved mobility, grade II/III  Neuromuscular Re-education: Seated L bow and arrow with trunk and head rotation to the L for improved symmetry of posture Seated thoracic extension with ball for improved posture and decreased pain Standing shoulder flexion at wall for improved thoracic extension and decreased pain   Patient educated throughout session on appropriate technique and form using multi-modal cueing, HEP, and activity modification. Patient articulated understanding and returned demonstration.  Patient Response to interventions: 0/10 pain  ASSESSMENT Patient presents to clinic with excellent motivation to participate in therapy. Patient demonstrates deficits in posture, pain free thoracic ROM, and function. Patient able to achieve significant pain reduction (3 pt decrease) with manual interventions during today's session and responded positively to active and manual interventions. Patient will benefit from continued skilled therapeutic intervention to address remaining deficits in posture, pain free thoracic ROM, and function in order to increase function and improve overall QOL.      PT Long Term Goals - 12/08/20 4944      PT LONG TERM GOAL #1   Title Patient will be independent with HEP in order to improve strength and decrease back pain in order to improve  pain-free function at home and in the community.    Baseline IE: provided    Time 4    Period Weeks    Status New    Target Date 01/04/21      PT LONG TERM GOAL #2   Title Patient will demonstrate improved function as evidenced by a score of 59 on FOTO measure for full participation in activities at home and in the community.    Baseline IE: 49    Time 4    Period Weeks    Status New    Target Date 01/04/21      PT LONG TERM GOAL #3   Title Patient will decrease worst  back pain as reported on NPRS by at least 2 points in order to demonstrate clinically significant reduction in back pain.    Baseline IE: 8-9/10    Time 4    Period Weeks    Status New    Target Date 01/04/21      PT LONG TERM GOAL #4   Title Patient will increase postural endurance/strength as evidenced by ability to tolerate standing posture > 30 min without increase in pain/headache in order to demonstrate improvement in strength and function.    Baseline IE: <15 min    Time 4    Period Weeks    Status New    Target Date 01/04/21                 Plan - 12/14/20 1505    Clinical Impression Statement Patient presents to clinic with excellent motivation to participate in therapy. Patient demonstrates deficits in posture, pain free thoracic ROM, and function. Patient able to achieve significant pain reduction (3 pt decrease) with manual interventions during today's session and responded positively to active and manual interventions. Patient will benefit from continued skilled therapeutic intervention to address remaining deficits in posture, pain free thoracic ROM, and function in order to increase function and improve overall QOL.    Personal Factors and Comorbidities Age;Behavior Pattern;Comorbidity 1;Past/Current Experience;Time since onset of injury/illness/exacerbation    Comorbidities asthma    Examination-Activity Limitations Bend;Caring for Others;Carry;Lift;Stand;Sit    Examination-Participation Restrictions Meal Prep;Cleaning;Yard Work;Community Activity;Shop;Laundry;Occupation    Stability/Clinical Decision Making Evolving/Moderate complexity    Rehab Potential Poor    PT Frequency 2x / week    PT Duration 4 weeks    PT Treatment/Interventions Patient/family education;Taping;Joint Manipulations;Spinal Manipulations;Dry needling;Manual techniques;Moist Heat;Neuromuscular re-education;Therapeutic exercise;Therapeutic activities;ADLs/Self Care Home Management;Aquatic  Therapy;Electrical Stimulation    PT Next Visit Plan manual as needed, postural strengthening    PT Home Exercise Plan doorway stretch    Consulted and Agree with Plan of Care Patient           Patient will benefit from skilled therapeutic intervention in order to improve the following deficits and impairments:  Pain,Improper body mechanics,Postural dysfunction,Decreased strength,Decreased activity tolerance,Decreased endurance  Visit Diagnosis: Abnormal posture  Pain in thoracic spine     Problem List Patient Active Problem List   Diagnosis Date Noted  . Symptomatic mammary hypertrophy 11/30/2020  . Back pain 11/30/2020  . Neck pain 11/30/2020  . Postpartum care following vaginal delivery 03/11/2016  . Proteinuria affecting pregnancy in third trimester 02/24/2016  . Asthma 02/24/2016    Sheria Lang PT, DPT [phone]  12/14/2020, 4:22 PM  [place] [facility] [address] [place], [place], [address] Phone: [phone]   Fax:  [phone]  Name: PATRICIA FARGO MRN: [id number] Date of Birth: 1996/10/11

## 2020-12-16 ENCOUNTER — Ambulatory Visit: Payer: Medicaid Other | Admitting: Physical Therapy

## 2020-12-16 ENCOUNTER — Other Ambulatory Visit: Payer: Self-pay

## 2020-12-16 ENCOUNTER — Encounter: Payer: Self-pay | Admitting: Physical Therapy

## 2020-12-16 DIAGNOSIS — M546 Pain in thoracic spine: Secondary | ICD-10-CM

## 2020-12-16 DIAGNOSIS — R293 Abnormal posture: Secondary | ICD-10-CM

## 2020-12-16 NOTE — Therapy (Signed)
[facility] [address] [place], [place], [address] Phone: [phone]   Fax:  [phone]  Physical Therapy Treatment  Patient Details  Name: Erica Glenn MRN: [id number] Date of Birth: 03-02-1996 Referring Provider (PT): Foster Simpson   Encounter Date: 12/16/2020   PT End of Session - 12/16/20 1501    Visit Number 3    Number of Visits 9    Date for PT Re-Evaluation 01/04/21    PT Start Time 1453    PT Stop Time 1548    PT Time Calculation (min) 55 min    Activity Tolerance Patient tolerated treatment well    Behavior During Therapy [facility] for tasks assessed/performed           Past Medical History:  Diagnosis Date  . Asthma   . H/O gestational diabetes in prior pregnancy, currently pregnant 2015   Scolosis    Past Surgical History:  Procedure Laterality Date  . CHOLECYSTECTOMY  2017  . NO PAST SURGERIES      There were no vitals filed for this visit.   Subjective Assessment - 12/16/20 1458    Subjective Patient reports that she had minimal soreness after last session. She reports continued use of stretches for pain relief with good effect of short duration. Patient notes that when she is in school during the day and has to carry her back pack she feels increased back pain, and continues to have increased pain with increased activity.    Pertinent History Patient notes longstanding history of pain. Patient had PT previously for scoliosis (S curve) and continues to apply some postural awareness principles.    Limitations Lifting;Standing;Walking;House hold activities    How long can you sit comfortably? w/ back support 90 min; w/o back support 15 min    How long can you stand comfortably? 15-30 min    How long can you walk comfortably? 15-30 min    Currently in Pain? Yes    Pain Score 7     Pain Location Thoracic    Pain Orientation --    Pain Descriptors / Indicators --    Pain Onset More than a month ago            TREATMENT Manual Therapy: STM and TPR performed to thoracic and lumbar mm to allow for decreased tension and pain and improved posture and function with vibratory and percussive instrument Thoracic CPA/UPA mobilizations for decreased spasm and improved mobility, grade II/III Intercostal mm release with posterior-lateral breathing  Neuromuscular Re-education: Reviewed HEP.  Seated posterior-lateral breathing with tactile cues for improved lower rib and diaphragmatic coordination/expansion Seated thread the needle L with poserior-lateral breathing for improved lower rib and diaphragmatic coordination/expansion  Patient educated throughout session on appropriate technique and form using multi-modal cueing, HEP, and activity modification. Patient articulated understanding and returned demonstration.  Patient Response to interventions: Denies increased pain.  ASSESSMENT Patient presents to clinic with excellent motivation to participate in therapy. Patient demonstrates deficits in posture, pain free thoracic ROM, and function. Patient able to achieve coordinated posterior-lateral breathing with and without corrective spinal rotation stretch during today's session and responded positively to active and manual interventions. Patient will benefit from continued skilled therapeutic intervention to address remaining deficits in posture, pain free thoracic ROM, and function in order to increase function and improve overall QOL.      PT Long Term Goals - 12/08/20 0832      PT LONG TERM GOAL #1  Title Patient will be independent with HEP in order to improve strength and decrease back pain in order to improve pain-free function at home and in the community.    Baseline IE: provided    Time 4    Period Weeks    Status New    Target Date 01/04/21      PT LONG TERM GOAL #2   Title Patient will demonstrate improved function as evidenced by a score of 59 on FOTO measure for full  participation in activities at home and in the community.    Baseline IE: 49    Time 4    Period Weeks    Status New    Target Date 01/04/21      PT LONG TERM GOAL #3   Title Patient will decrease worst back pain as reported on NPRS by at least 2 points in order to demonstrate clinically significant reduction in back pain.    Baseline IE: 8-9/10    Time 4    Period Weeks    Status New    Target Date 01/04/21      PT LONG TERM GOAL #4   Title Patient will increase postural endurance/strength as evidenced by ability to tolerate standing posture > 30 min without increase in pain/headache in order to demonstrate improvement in strength and function.    Baseline IE: <15 min    Time 4    Period Weeks    Status New    Target Date 01/04/21                 Plan - 12/16/20 1501    Clinical Impression Statement Patient presents to clinic with excellent motivation to participate in therapy. Patient demonstrates deficits in posture, pain free thoracic ROM, and function. Patient able to achieve coordinated posterior-lateral breathing with and without corrective spinal rotation stretch during today's session and responded positively to active and manual interventions. Patient will benefit from continued skilled therapeutic intervention to address remaining deficits in posture, pain free thoracic ROM, and function in order to increase function and improve overall QOL.    Personal Factors and Comorbidities Age;Behavior Pattern;Comorbidity 1;Past/Current Experience;Time since onset of injury/illness/exacerbation    Comorbidities asthma    Examination-Activity Limitations Bend;Caring for Others;Carry;Lift;Stand;Sit    Examination-Participation Restrictions Meal Prep;Cleaning;Yard Work;Community Activity;Shop;Laundry;Occupation    Stability/Clinical Decision Making Evolving/Moderate complexity    Rehab Potential Poor    PT Frequency 2x / week    PT Duration 4 weeks    PT Treatment/Interventions  Patient/family education;Taping;Joint Manipulations;Spinal Manipulations;Dry needling;Manual techniques;Moist Heat;Neuromuscular re-education;Therapeutic exercise;Therapeutic activities;ADLs/Self Care Home Management;Aquatic Therapy;Electrical Stimulation    PT Next Visit Plan manual as needed, postural strengthening    PT Home Exercise Plan doorway stretch    Consulted and Agree with Plan of Care Patient           Patient will benefit from skilled therapeutic intervention in order to improve the following deficits and impairments:  Pain,Improper body mechanics,Postural dysfunction,Decreased strength,Decreased activity tolerance,Decreased endurance  Visit Diagnosis: Abnormal posture  Pain in thoracic spine     Problem List Patient Active Problem List   Diagnosis Date Noted  . Symptomatic mammary hypertrophy 11/30/2020  . Back pain 11/30/2020  . Neck pain 11/30/2020  . Postpartum care following vaginal delivery 03/11/2016  . Proteinuria affecting pregnancy in third trimester 02/24/2016  . Asthma 02/24/2016   Sheria Lang PT, DPT [phone]  12/16/2020, 3:55 PM  [place] [facility] [address] [place],  [place], [address] Phone: [phone]   Fax:  [phone]  Name: Erica Glenn MRN: [id number] Date of Birth: 03-Mar-1996

## 2020-12-21 ENCOUNTER — Encounter: Payer: Self-pay | Admitting: Physical Therapy

## 2020-12-21 ENCOUNTER — Encounter: Payer: Self-pay | Admitting: Dietician

## 2020-12-21 ENCOUNTER — Other Ambulatory Visit: Payer: Self-pay

## 2020-12-21 ENCOUNTER — Ambulatory Visit: Payer: Medicaid Other | Admitting: Physical Therapy

## 2020-12-21 DIAGNOSIS — M546 Pain in thoracic spine: Secondary | ICD-10-CM

## 2020-12-21 DIAGNOSIS — R293 Abnormal posture: Secondary | ICD-10-CM | POA: Diagnosis not present

## 2020-12-21 NOTE — Progress Notes (Signed)
Have not heard back from patient to reschedule her missed appointment from 11/10/20. Sent notification to referring provider.

## 2020-12-21 NOTE — Therapy (Signed)
[place] [facility] [address] [place], [place], [address] Phone: [phone]   Fax:  [phone]  Physical Therapy Treatment  Patient Details  Name: Erica Glenn MRN: [id number] Date of Birth: [birth year] Referring Provider (PT): Foster Simpson   Encounter Date: 12/21/2020   PT End of Session - 12/21/20 1407    Visit Number 4    Number of Visits 9    Date for PT Re-Evaluation 01/04/21    PT Start Time 1400    PT Stop Time 1455    PT Time Calculation (min) 55 min    Activity Tolerance Patient tolerated treatment well    Behavior During Therapy [facility] for tasks assessed/performed           Past Medical History:  Diagnosis Date  . Asthma   . H/O gestational diabetes in prior pregnancy, currently pregnant 2015   Scolosis    Past Surgical History:  Procedure Laterality Date  . CHOLECYSTECTOMY  2017  . NO PAST SURGERIES      There were no vitals filed for this visit.   Subjective Assessment - 12/21/20 1406    Subjective Patient denies increased soreness after last session. Patient does not report any significant changes/concerns since last session.    Pertinent History Patient notes longstanding history of pain. Patient had PT previously for scoliosis (S curve) and continues to apply some postural awareness principles.    Limitations Lifting;Standing;Walking;House hold activities    How long can you sit comfortably? w/ back support 90 min; w/o back support 15 min    How long can you stand comfortably? 15-30 min    How long can you walk comfortably? 15-30 min    Currently in Pain? Yes    Pain Score 3     Pain Location Thoracic    Pain Onset More than a month ago           TREATMENT Manual Therapy: STM and TPR performed to thoracic and lumbar mm to allow for decreased tension and pain and improved posture and function with vibratory and percussive instrument Thoracic CPA/UPA mobilizations for decreased spasm and  improved mobility, grade II/III Intercostal mm release with posterior-lateral breathing  Neuromuscular Re-education: Sidelying, thoracolumbar rotations (open-book) bilaterally with diaphragmatic breathing for improved diaphragmatic and rib cage excursion. VCs and TCs to prevent compensations. Child's Pose with diaphragmatic breath for improved spinal mobility and mm relaxation Cat/Cow with coordinated breath pattern for improved spinal mobility Quadruped thread the needle for improved spinal mobility and mm relaxation   Patient educated throughout session on appropriate technique and form using multi-modal cueing, HEP, and activity modification. Patient articulated understanding and returned demonstration.  Patient Response to interventions: Denies increased pain.  ASSESSMENT Patient presents to clinic with excellent motivation to participate in therapy. Patient demonstrates deficits in posture, pain free thoracic ROM, and function. Patient tolerated all spinal mobility/postural activities with no increase in pain during today's session and responded positively to active and manual interventions. Patient will benefit from continued skilled therapeutic intervention to address remaining deficits in posture, pain free thoracic ROM, and function in order to increase function and improve overall QOL.      PT Long Term Goals - 12/08/20 8101      PT LONG TERM GOAL #1   Title Patient will be independent with HEP in order to improve strength and decrease back pain in order to improve pain-free function at home and in the community.    Baseline IE: provided  Time 4    Period Weeks    Status New    Target Date 01/04/21      PT LONG TERM GOAL #2   Title Patient will demonstrate improved function as evidenced by a score of 59 on FOTO measure for full participation in activities at home and in the community.    Baseline IE: 49    Time 4    Period Weeks    Status New    Target Date 01/04/21       PT LONG TERM GOAL #3   Title Patient will decrease worst back pain as reported on NPRS by at least 2 points in order to demonstrate clinically significant reduction in back pain.    Baseline IE: 8-9/10    Time 4    Period Weeks    Status New    Target Date 01/04/21      PT LONG TERM GOAL #4   Title Patient will increase postural endurance/strength as evidenced by ability to tolerate standing posture > 30 min without increase in pain/headache in order to demonstrate improvement in strength and function.    Baseline IE: <15 min    Time 4    Period Weeks    Status New    Target Date 01/04/21                 Plan - 12/21/20 1410    Clinical Impression Statement Patient presents to clinic with excellent motivation to participate in therapy. Patient demonstrates deficits in posture, pain free thoracic ROM, and function. Patient tolerated all spinal mobility/postural activities with no increase in pain during today's session and responded positively to active and manual interventions. Patient will benefit from continued skilled therapeutic intervention to address remaining deficits in posture, pain free thoracic ROM, and function in order to increase function and improve overall QOL.    Personal Factors and Comorbidities Age;Behavior Pattern;Comorbidity 1;Past/Current Experience;Time since onset of injury/illness/exacerbation    Comorbidities asthma    Examination-Activity Limitations Bend;Caring for Others;Carry;Lift;Stand;Sit    Examination-Participation Restrictions Meal Prep;Cleaning;Yard Work;Community Activity;Shop;Laundry;Occupation    Stability/Clinical Decision Making Evolving/Moderate complexity    Rehab Potential Poor    PT Frequency 2x / week    PT Duration 4 weeks    PT Treatment/Interventions Patient/family education;Taping;Joint Manipulations;Spinal Manipulations;Dry needling;Manual techniques;Moist Heat;Neuromuscular re-education;Therapeutic exercise;Therapeutic  activities;ADLs/Self Care Home Management;Aquatic Therapy;Electrical Stimulation    PT Next Visit Plan manual as needed, postural strengthening    PT Home Exercise Plan doorway stretch    Consulted and Agree with Plan of Care Patient           Patient will benefit from skilled therapeutic intervention in order to improve the following deficits and impairments:  Pain,Improper body mechanics,Postural dysfunction,Decreased strength,Decreased activity tolerance,Decreased endurance  Visit Diagnosis: Abnormal posture  Pain in thoracic spine     Problem List Patient Active Problem List   Diagnosis Date Noted  . Symptomatic mammary hypertrophy 11/30/2020  . Back pain 11/30/2020  . Neck pain 11/30/2020  . Postpartum care following vaginal delivery 03/11/2016  . Proteinuria affecting pregnancy in third trimester 02/24/2016  . Asthma 02/24/2016   Sheria Lang PT, DPT [phone]  12/21/2020, 4:43 PM  [place] [facility] [address] [place], [place], [address] Phone: [phone]   Fax:  [phone]  Name: Erica Glenn MRN: [id number] Date of Birth: 03/15/1996

## 2020-12-23 ENCOUNTER — Encounter: Payer: Self-pay | Admitting: Physical Therapy

## 2020-12-23 ENCOUNTER — Ambulatory Visit: Payer: Medicaid Other | Admitting: Physical Therapy

## 2020-12-23 ENCOUNTER — Other Ambulatory Visit: Payer: Self-pay

## 2020-12-23 DIAGNOSIS — R293 Abnormal posture: Secondary | ICD-10-CM

## 2020-12-23 DIAGNOSIS — M546 Pain in thoracic spine: Secondary | ICD-10-CM

## 2020-12-23 NOTE — Therapy (Signed)
[place] [facility] [address] [place], [place], [address] Phone: [phone]   Fax:  [phone]  Physical Therapy Treatment  Patient Details  Name: Erica Glenn MRN: [id number] Date of Birth: August 10, 1996 Referring Provider (PT): Foster Simpson   Encounter Date: 12/23/2020   PT End of Session - 12/23/20 1301    Visit Number 5    Number of Visits 9    Date for PT Re-Evaluation 01/04/21    PT Start Time 1300    PT Stop Time 1355    PT Time Calculation (min) 55 min    Activity Tolerance Patient tolerated treatment well    Behavior During Therapy [facility] for tasks assessed/performed           Past Medical History:  Diagnosis Date  . Asthma   . H/O gestational diabetes in prior pregnancy, currently pregnant 2015   Scolosis    Past Surgical History:  Procedure Laterality Date  . CHOLECYSTECTOMY  2017  . NO PAST SURGERIES      There were no vitals filed for this visit.   Subjective Assessment - 12/23/20 1305    Subjective Patient denies any significant changes since last session. She notes her pain to be moderate.    Pertinent History Patient notes longstanding history of pain. Patient had PT previously for scoliosis (S curve) and continues to apply some postural awareness principles.    Limitations Lifting;Standing;Walking;House hold activities    How long can you sit comfortably? w/ back support 90 min; w/o back support 15 min    How long can you stand comfortably? 15-30 min    How long can you walk comfortably? 15-30 min    Currently in Pain? Yes    Pain Score 5     Pain Location Thoracic    Pain Onset More than a month ago          TREATMENT Manual Therapy: STM and TPR performed to thoracic and lumbar mm to allow for decreased tension and pain and improved posture and function with vibratory and percussive instrument Thoracic CPA/UPA mobilizations for decreased spasm and improved mobility, grade  II/III  Neuromuscular Re-education: Physioball lumbopelvic mobility series for improved postural awareness and pain management:  Anterior-posterior pelvic tilts  Lateral pelvic tilts  Pelvic clocks, CW and CCW  TrA with OH reach  TrA with heel lift  Roll out (in chair)  R lateral flexion roll out   Patient educated throughout session on appropriate technique and form using multi-modal cueing, HEP, and activity modification. Patient articulated understanding and returned demonstration.  Patient Response to interventions: Patient states "I feel good."  ASSESSMENT Patient presents to clinic with excellent motivation to participate in therapy. Patient demonstrates deficits in posture, pain free thoracic ROM, and function. Patient tolerated all spinal mobility/postural activities however did not some increase in pain which was resolved with additional manual therapy during today's session and responded positively to active and manual interventions. Patient will benefit from continued skilled therapeutic intervention to address remaining deficits in posture, pain free thoracic ROM, and function in order to increase function and improve overall QOL.     PT Long Term Goals - 12/23/20 1310      PT LONG TERM GOAL #1   Title Patient will be independent with HEP in order to improve strength and decrease back pain in order to improve pain-free function at home and in the community.    Baseline IE: provided; 1/27: IND    Time  4    Period Weeks    Status Achieved    Target Date 01/04/21      PT LONG TERM GOAL #2   Title Patient will demonstrate improved function as evidenced by a score of 59 on FOTO measure for full participation in activities at home and in the community.    Baseline IE: 49; 1/27: 41    Time 4    Period Weeks    Status On-going    Target Date 01/04/21      PT LONG TERM GOAL #3   Title Patient will decrease worst back pain as reported on NPRS by at least 2 points in order  to demonstrate clinically significant reduction in back pain.    Baseline IE: 8-9/10; 1/27: 7/10    Time 4    Period Weeks    Status On-going    Target Date 01/04/21      PT LONG TERM GOAL #4   Title Patient will increase postural endurance/strength as evidenced by ability to tolerate standing posture > 30 min without increase in pain/headache in order to demonstrate improvement in strength and function.    Baseline IE: <15 min; 1/27: <15 min    Time 4    Period Weeks    Status On-going    Target Date 01/04/21                 Plan - 12/23/20 1301    Clinical Impression Statement Patient presents to clinic with excellent motivation to participate in therapy. Patient demonstrates deficits in posture, pain free thoracic ROM, and function. Patient tolerated all spinal mobility/postural activities however did not some increase in pain which was resolved with additional manual therapy during today's session and responded positively to active and manual interventions. Patient will benefit from continued skilled therapeutic intervention to address remaining deficits in posture, pain free thoracic ROM, and function in order to increase function and improve overall QOL.    Personal Factors and Comorbidities Age;Behavior Pattern;Comorbidity 1;Past/Current Experience;Time since onset of injury/illness/exacerbation    Comorbidities asthma    Examination-Activity Limitations Bend;Caring for Others;Carry;Lift;Stand;Sit    Examination-Participation Restrictions Meal Prep;Cleaning;Yard Work;Community Activity;Shop;Laundry;Occupation    Stability/Clinical Decision Making Evolving/Moderate complexity    Rehab Potential Poor    PT Frequency 2x / week    PT Duration 4 weeks    PT Treatment/Interventions Patient/family education;Taping;Joint Manipulations;Spinal Manipulations;Dry needling;Manual techniques;Moist Heat;Neuromuscular re-education;Therapeutic exercise;Therapeutic activities;ADLs/Self Care  Home Management;Aquatic Therapy;Electrical Stimulation    PT Next Visit Plan manual as needed, postural strengthening    PT Home Exercise Plan doorway stretch    Consulted and Agree with Plan of Care Patient           Patient will benefit from skilled therapeutic intervention in order to improve the following deficits and impairments:  Pain,Improper body mechanics,Postural dysfunction,Decreased strength,Decreased activity tolerance,Decreased endurance  Visit Diagnosis: Abnormal posture  Pain in thoracic spine     Problem List Patient Active Problem List   Diagnosis Date Noted  . Symptomatic mammary hypertrophy 11/30/2020  . Back pain 11/30/2020  . Neck pain 11/30/2020  . Postpartum care following vaginal delivery 03/11/2016  . Proteinuria affecting pregnancy in third trimester 02/24/2016  . Asthma 02/24/2016   Sheria Lang PT, DPT [phone]  12/23/2020, 4:19 PM  [place] [facility] [address] [place], [place], [address] Phone: [phone]   Fax:  [phone]  Name: Erica Glenn MRN: [id number] Date of Birth: [birth year]

## 2020-12-28 ENCOUNTER — Ambulatory Visit: Payer: Medicaid Other | Attending: Plastic Surgery | Admitting: Physical Therapy

## 2020-12-28 ENCOUNTER — Other Ambulatory Visit: Payer: Self-pay

## 2020-12-28 ENCOUNTER — Encounter: Payer: Self-pay | Admitting: Physical Therapy

## 2020-12-28 DIAGNOSIS — M546 Pain in thoracic spine: Secondary | ICD-10-CM | POA: Diagnosis not present

## 2020-12-28 DIAGNOSIS — R293 Abnormal posture: Secondary | ICD-10-CM | POA: Insufficient documentation

## 2020-12-28 NOTE — Therapy (Signed)
[facility] [address]. [place], [place], [address] Phone: [phone]   Fax:  [phone]  Physical Therapy Treatment  Patient Details  Name: Erica Glenn MRN: [id number] Date of Birth: 09-Sep-1996 Referring Provider (PT): Foster Simpson   Encounter Date: 12/28/2020   PT End of Session - 12/28/20 1404    Visit Number 6    Number of Visits 9    Date for PT Re-Evaluation 01/04/21    PT Start Time 1400    PT Stop Time 1455    PT Time Calculation (min) 55 min    Activity Tolerance Patient tolerated treatment well    Behavior During Therapy [facility] for tasks assessed/performed           Past Medical History:  Diagnosis Date  . Asthma   . H/O gestational diabetes in prior pregnancy, currently pregnant 2015   Scolosis    Past Surgical History:  Procedure Laterality Date  . CHOLECYSTECTOMY  2017  . NO PAST SURGERIES      There were no vitals filed for this visit.   Subjective Assessment - 12/28/20 1402    Subjective Patient denies any significant changes since last session. Patient notes her pain continues to be increased with gravity dependent positioning.    Pertinent History Patient notes longstanding history of pain. Patient had PT previously for scoliosis (S curve) and continues to apply some postural awareness principles.    Limitations Lifting;Standing;Walking;House hold activities    How long can you sit comfortably? w/ back support 90 min; w/o back support 15 min    How long can you stand comfortably? 15-30 min    How long can you walk comfortably? 15-30 min    Currently in Pain? Yes    Pain Score 6     Pain Location Thoracic    Pain Onset More than a month ago           TREATMENT Manual Therapy: STM and TPR performed to thoracic and lumbar mm to allow for decreased tension and pain and improved posture and function with vibratory and percussive instrument Thoracic CPA/UPA mobilizations for decreased  spasm and improved mobility, grade II/III  Neuromuscular Re-education: Balance disc lumbopelvic mobility series for improved postural awareness and pain management:  Anterior-posterior pelvic tilts  TrA with (90/90 shoulder ER/flexion) scap retraction Seated posterior-lateral breathing with tactile cues for improved lower rib and diaphragmatic coordination/expansion Seated thread the needle L with poserior-lateral breathing for improved lower rib and diaphragmatic coordination/expansion   Treatments unbilled: IFC applied to L lumbar region, 8.4 mA, 10 min duration (during neuromuscular re-education)   Patient educated throughout session on appropriate technique and form using multi-modal cueing, HEP, and activity modification. Patient articulated understanding and returned demonstration.  Patient Response to interventions: 0/10 pain  ASSESSMENT Patient presents to clinic with excellent motivation to participate in therapy. Patient demonstrates deficits in posture, pain free thoracic ROM, and function. Patient continues to have significant in-session pain reduction with manual therapy, gentle stretching, and pain modulating modalities (IFC). Patient performed all postural activities with good form during today's session and responded positively to active and manual interventions. Patient will benefit from continued skilled therapeutic intervention to address remaining deficits in posture, pain free thoracic ROM, and function in order to increase function and improve overall QOL.      PT Long Term Goals - 12/23/20 1310      PT LONG TERM GOAL #1   Title Patient will be independent with  HEP in order to improve strength and decrease back pain in order to improve pain-free function at home and in the community.    Baseline IE: provided; 1/27: IND    Time 4    Period Weeks    Status Achieved    Target Date 01/04/21      PT LONG TERM GOAL #2   Title Patient will demonstrate improved function  as evidenced by a score of 59 on FOTO measure for full participation in activities at home and in the community.    Baseline IE: 49; 1/27: 41    Time 4    Period Weeks    Status On-going    Target Date 01/04/21      PT LONG TERM GOAL #3   Title Patient will decrease worst back pain as reported on NPRS by at least 2 points in order to demonstrate clinically significant reduction in back pain.    Baseline IE: 8-9/10; 1/27: 7/10    Time 4    Period Weeks    Status On-going    Target Date 01/04/21      PT LONG TERM GOAL #4   Title Patient will increase postural endurance/strength as evidenced by ability to tolerate standing posture > 30 min without increase in pain/headache in order to demonstrate improvement in strength and function.    Baseline IE: <15 min; 1/27: <15 min    Time 4    Period Weeks    Status On-going    Target Date 01/04/21                 Plan - 12/28/20 1407    Clinical Impression Statement Patient presents to clinic with excellent motivation to participate in therapy. Patient demonstrates deficits in posture, pain free thoracic ROM, and function. Patient continues to have significant in-session pain reduction with manual therapy, gentle stretching, and pain modulating modalities (IFC). Patient performed all postural activities with good form during today's session and responded positively to active and manual interventions. Patient will benefit from continued skilled therapeutic intervention to address remaining deficits in posture, pain free thoracic ROM, and function in order to increase function and improve overall QOL.    Personal Factors and Comorbidities Age;Behavior Pattern;Comorbidity 1;Past/Current Experience;Time since onset of injury/illness/exacerbation    Comorbidities asthma    Examination-Activity Limitations Bend;Caring for Others;Carry;Lift;Stand;Sit    Examination-Participation Restrictions Meal Prep;Cleaning;Yard Work;Community  Activity;Shop;Laundry;Occupation    Stability/Clinical Decision Making Evolving/Moderate complexity    Rehab Potential Poor    PT Frequency 2x / week    PT Duration 4 weeks    PT Treatment/Interventions Patient/family education;Taping;Joint Manipulations;Spinal Manipulations;Dry needling;Manual techniques;Moist Heat;Neuromuscular re-education;Therapeutic exercise;Therapeutic activities;ADLs/Self Care Home Management;Aquatic Therapy;Electrical Stimulation    PT Next Visit Plan manual as needed, postural strengthening    PT Home Exercise Plan doorway stretch    Consulted and Agree with Plan of Care Patient           Patient will benefit from skilled therapeutic intervention in order to improve the following deficits and impairments:  Pain,Improper body mechanics,Postural dysfunction,Decreased strength,Decreased activity tolerance,Decreased endurance  Visit Diagnosis: Pain in thoracic spine  Abnormal posture     Problem List Patient Active Problem List   Diagnosis Date Noted  . Symptomatic mammary hypertrophy 11/30/2020  . Back pain 11/30/2020  . Neck pain 11/30/2020  . Postpartum care following vaginal delivery 03/11/2016  . Proteinuria affecting pregnancy in third trimester 02/24/2016  . Asthma 02/24/2016   Sheria Lang PT, DPT [phone]  12/28/2020, 4:44  PM  [facility] [address] [place], [place], [address] Phone: [phone]   Fax:  [phone]  Name: MARGURITE DUFFY MRN: [id number] Date of Birth: October 31, 1996

## 2020-12-30 ENCOUNTER — Encounter: Payer: Self-pay | Admitting: Physical Therapy

## 2020-12-30 ENCOUNTER — Ambulatory Visit: Payer: Medicaid Other | Admitting: Physical Therapy

## 2020-12-30 DIAGNOSIS — M546 Pain in thoracic spine: Secondary | ICD-10-CM

## 2020-12-30 DIAGNOSIS — R293 Abnormal posture: Secondary | ICD-10-CM

## 2020-12-30 NOTE — Therapy (Signed)
[facility] [address] [place], [place], [address] Phone: [phone]   Fax:  [phone]  Physical Therapy Treatment  Patient Details  Name: KAYREN HOLCK MRN: [id number] Date of Birth: 07-23-1996 Referring Provider (PT): Audelia Hives   Encounter Date: 12/30/2020   PT End of Session - 12/30/20 1304    Visit Number 7    Number of Visits 9    Date for PT Re-Evaluation 01/04/21    PT Start Time 1300    PT Stop Time 1355    PT Time Calculation (min) 55 min    Activity Tolerance Patient tolerated treatment well    Behavior During Therapy [facility] for tasks assessed/performed           Past Medical History:  Diagnosis Date  . Asthma   . H/O gestational diabetes in prior pregnancy, currently pregnant 2015   Scolosis    Past Surgical History:  Procedure Laterality Date  . CHOLECYSTECTOMY  2017  . NO PAST SURGERIES      There were no vitals filed for this visit.   Subjective Assessment - 12/30/20 1302    Subjective Patient presents to clinic with 5/10 back pain after sitting in class all morning. She notes some wrist pain as well with radial nerve tensioning positions.    Pertinent History Patient notes longstanding history of pain. Patient had PT previously for scoliosis (S curve) and continues to apply some postural awareness principles.    Limitations Lifting;Standing;Walking;House hold activities    How long can you sit comfortably? w/ back support 90 min; w/o back support 15 min    How long can you stand comfortably? 15-30 min    How long can you walk comfortably? 15-30 min    Currently in Pain? Yes    Pain Score 5     Pain Onset More than a month ago           TREATMENT  Neuromuscular Re-education: Supine hooklying diaphragmatic breathing with VCs and TCs for downregulation of the nervous system and improved management of IAP Supine hooklying trunk rotations for improved spinal mobility and decreased  pain Supine hooklying posterior pelvic tilts for improved spinal mobility and decreased pain Supine hooklying posterior pelvic tilts with alternating shoulder flexion for improved spinal mobility and decreased pain Supine hooklying posterior pelvic tilts with B shoulder flexion for improved spinal mobility and decreased pain Supine hooklying snow angels for improved thoracic posture and decreased pain  Treatments unbilled: IFC applied to lumbar region, 6.0 mA, 20 min duration (during neuromuscular re-education)   Patient educated throughout session on appropriate technique and form using multi-modal cueing, HEP, and activity modification. Patient articulated understanding and returned demonstration.  Patient Response to interventions: 0/10 pain  ASSESSMENT Patient presents to clinic with excellent motivation to participate in therapy. Patient demonstrates deficits in posture, pain free thoracic ROM, and function. Patient responded well to concurrent pain modalities and gentle spinal mobility. Patient has not made significant progress toward goals despite in-session success; carryover has been quite limited likely 2/2 to the postural imbalance created by mammary hypertrophy. Patient will benefit from continued skilled therapeutic intervention to address remaining deficits in posture, pain free thoracic ROM, and function in order to increase function and improve overall QOL.     PT Long Term Goals - 12/30/20 1348      PT LONG TERM GOAL #1   Title Patient will be independent with HEP in order to improve strength and decrease back  pain in order to improve pain-free function at home and in the community.    Baseline IE: provided; 1/27: IND    Time 4    Period Weeks    Status Achieved      PT LONG TERM GOAL #2   Title Patient will demonstrate improved function as evidenced by a score of 59 on FOTO measure for full participation in activities at home and in the community.    Baseline IE: 49;  1/27: 41; 2/3: 40    Time 4    Period Weeks    Status Not Met      PT LONG TERM GOAL #3   Title Patient will decrease worst back pain as reported on NPRS by at least 2 points in order to demonstrate clinically significant reduction in back pain.    Baseline IE: 8-9/10; 1/27: 7/10; 2/3: 7-8/10    Time 4    Period Weeks    Status Not Met      PT LONG TERM GOAL #4   Title Patient will increase postural endurance/strength as evidenced by ability to tolerate standing posture > 30 min without increase in pain/headache in order to demonstrate improvement in strength and function.    Baseline IE: <15 min; 1/27: <15 min; 2/3: <15 min    Time 4    Period Weeks    Status Not Met                 Plan - 12/30/20 1417    Clinical Impression Statement Patient presents to clinic with excellent motivation to participate in therapy. Patient demonstrates deficits in posture, pain free thoracic ROM, and function. Patient responded well to concurrent pain modalities and gentle spinal mobility. Patient has not made significant progress toward goals despite in-session success; carryover has been quite limited likely 2/2 to the postural imbalance created by mammary hypertrophy. Patient will benefit from continued skilled therapeutic intervention to address remaining deficits in posture, pain free thoracic ROM, and function in order to increase function and improve overall QOL.    Personal Factors and Comorbidities Age;Behavior Pattern;Comorbidity 1;Past/Current Experience;Time since onset of injury/illness/exacerbation    Comorbidities asthma    Examination-Activity Limitations Bend;Caring for Others;Carry;Lift;Stand;Sit    Examination-Participation Restrictions Meal Prep;Cleaning;Yard Work;Community Activity;Shop;Laundry;Occupation    Stability/Clinical Decision Making Evolving/Moderate complexity    Rehab Potential Poor    PT Frequency 2x / week    PT Duration 4 weeks    PT Treatment/Interventions  Patient/family education;Taping;Joint Manipulations;Spinal Manipulations;Dry needling;Manual techniques;Moist Heat;Neuromuscular re-education;Therapeutic exercise;Therapeutic activities;ADLs/Self Care Home Management;Aquatic Therapy;Electrical Stimulation    PT Next Visit Plan manual as needed, postural strengthening    PT Home Exercise Plan doorway stretch    Consulted and Agree with Plan of Care Patient           Patient will benefit from skilled therapeutic intervention in order to improve the following deficits and impairments:  Pain,Improper body mechanics,Postural dysfunction,Decreased strength,Decreased activity tolerance,Decreased endurance  Visit Diagnosis: Pain in thoracic spine  Abnormal posture     Problem List Patient Active Problem List   Diagnosis Date Noted  . Symptomatic mammary hypertrophy 11/30/2020  . Back pain 11/30/2020  . Neck pain 11/30/2020  . Postpartum care following vaginal delivery 03/11/2016  . Proteinuria affecting pregnancy in third trimester 02/24/2016  . Asthma 02/24/2016   Myles Gip PT, DPT [phone]  12/30/2020, 2:17 PM  [facility] [address] [place], [place], [address] Phone: [phone]   Fax:  [phone]  Name: TING CAGE MRN: [id number] Date of Birth: May 07, 1996

## 2021-01-04 ENCOUNTER — Ambulatory Visit: Payer: Medicaid Other | Admitting: Physical Therapy

## 2021-01-11 ENCOUNTER — Ambulatory Visit: Payer: Medicaid Other | Admitting: Physical Therapy

## 2021-01-18 ENCOUNTER — Encounter: Payer: Medicaid Other | Admitting: Physical Therapy

## 2021-02-09 ENCOUNTER — Ambulatory Visit: Payer: Self-pay

## 2021-03-03 ENCOUNTER — Other Ambulatory Visit: Payer: Self-pay

## 2021-03-03 ENCOUNTER — Ambulatory Visit
Admission: EM | Admit: 2021-03-03 | Discharge: 2021-03-03 | Disposition: A | Discharge status: Home / Self Care | Payer: Medicaid Other | Attending: Sports Medicine | Admitting: Sports Medicine

## 2021-03-03 ENCOUNTER — Encounter: Payer: Self-pay | Admitting: Emergency Medicine

## 2021-03-03 DIAGNOSIS — J0300 Acute streptococcal tonsillitis, unspecified: Secondary | ICD-10-CM | POA: Diagnosis not present

## 2021-03-03 HISTORY — DX: Other seasonal allergic rhinitis: J30.2

## 2021-03-03 LAB — GROUP A STREP BY PCR: Group A Strep by PCR: DETECTED — AB

## 2021-03-03 MED ORDER — PREDNISONE 20 MG PO TABS: 40.0000 mg | ORAL_TABLET | Freq: Every day | ORAL | 0 refills | Status: AC

## 2021-03-03 MED ORDER — AMOXICILLIN-POT CLAVULANATE 875-125 MG PO TABS: 1.0000 | ORAL_TABLET | Freq: Two times a day (BID) | ORAL | 0 refills | Status: AC

## 2021-03-03 MED ORDER — DEXAMETHASONE SODIUM PHOSPHATE 10 MG/ML IJ SOLN
10.0000 mg | Freq: Once | INTRAMUSCULAR | Status: AC
  Administered 2021-03-03: 10 mg via INTRAMUSCULAR

## 2021-03-03 NOTE — Discharge Instructions (Signed)
Your strep test was positive.  I have sent amoxicillin to the pharmacy.  You were tonsils are very swollen so you been given an injection of dexamethasone in the clinic.  Have also sent a couple more days of a corticosteroid.  Start prednisone tomorrow, but amoxicillin today. If you have any fevers or increased difficulty swallowing over the next couple days she should return her to ED.

## 2021-03-03 NOTE — ED Triage Notes (Signed)
Patient in today c/o sore throat x yesterday. Patient denies any fever or any other symptoms. Patient has used OTC cough drops for her symptoms. Patient has not had the covid vaccines. Patient had covid 10/2020.

## 2021-03-03 NOTE — ED Provider Notes (Signed)
[facility]    CSN: [id number] Arrival date & time: 03/03/21  1235      History   Chief Complaint Chief Complaint  Patient presents with  . Sore Throat    HPI Erica Glenn is a 25 y.o. female presenting for onset of sore throat and painful swallowing yesterday.  Denies any fever, fatigue, body aches, cough, congestion, breathing difficulty, nausea/vomiting or diarrhea.  No sick contacts and no known exposure to strep throat, influenza or COVID-19.  Has used over-the-counter cough drops but no other medications.  Not vaccinated for COVID-19.  Personal history of COVID-19 in December 2021.  Past medical history significant for asthma.  No other complaints or concerns.  HPI  Past Medical History:  Diagnosis Date  . Asthma   . H/O gestational diabetes in prior pregnancy, currently pregnant 2015   Scolosis  . Seasonal allergies     Patient Active Problem List   Diagnosis Date Noted  . Symptomatic mammary hypertrophy 11/30/2020  . Back pain 11/30/2020  . Neck pain 11/30/2020  . Postpartum care following vaginal delivery 03/11/2016  . Proteinuria affecting pregnancy in third trimester 02/24/2016  . Asthma 02/24/2016    Past Surgical History:  Procedure Laterality Date  . CHOLECYSTECTOMY  2017    OB History    Gravida  2   Para  2   Term  2   Preterm      AB      Living  2     SAB      IAB      Ectopic      Multiple  0   Live Births  2            Home Medications    Prior to Admission medications   Medication Sig Start Date End Date Taking? Authorizing Provider  amoxicillin-clavulanate (AUGMENTIN) 875-125 MG tablet Take 1 tablet by mouth every 12 (twelve) hours for 10 days. 03/03/21 03/13/21 Yes Shirlee Latch, PA-C  cetirizine (ZYRTEC) 10 MG tablet Take 10 mg by mouth daily. 08/23/20  Yes [provider]  levonorgestrel (MIRENA) 20 MCG/24HR IUD 1 each by Intrauterine route once.   Yes [provider]  predniSONE  (DELTASONE) 20 MG tablet Take 2 tablets (40 mg total) by mouth daily for 3 days. 03/03/21 03/06/21 Yes Shirlee Latch, PA-C  PROAIR HFA [phone] Base) MCG/ACT inhaler Inhale into the lungs. Patient not taking: No sig reported 07/23/20   [provider]    Family History Family History  Problem Relation Age of Onset  . Healthy Mother   . Cirrhosis Father   . Alcohol abuse Father     Social History Social History   Tobacco Use  . Smoking status: Never Smoker  . Smokeless tobacco: Never Used  Vaping Use  . Vaping Use: Never used  Substance Use Topics  . Alcohol use: No  . Drug use: No     Allergies   Patient has no known allergies.   Review of Systems Review of Systems  Constitutional: Negative for chills, diaphoresis, fatigue and fever.  HENT: Positive for sore throat. Negative for congestion, ear pain, rhinorrhea, sinus pressure and sinus pain.   Respiratory: Negative for cough and shortness of breath.   Gastrointestinal: Negative for abdominal pain, nausea and vomiting.  Musculoskeletal: Negative for arthralgias and myalgias.  Skin: Negative for rash.  Neurological: Negative for weakness and headaches.  Hematological: Negative for adenopathy.     Physical Exam Triage  Vital Signs ED Triage Vitals  Enc Vitals Group     BP 03/03/21 1249 126/80     Pulse Rate 03/03/21 1249 75     Resp 03/03/21 1249 18     Temp 03/03/21 1249 98.6 F (37 C)     Temp Source 03/03/21 1249 Oral     SpO2 03/03/21 1249 100 %     Weight 03/03/21 1248 173 lb (78.5 kg)     Height 03/03/21 1248 4\' 11"  (1.499 m)     Head Circumference --      Peak Flow --      Pain Score 03/03/21 1247 5     Pain Loc --      Pain Edu? --      Excl. in GC? --    No data found.  Updated Vital Signs BP 126/80 (BP Location: Left Arm)   Pulse 75   Temp 98.6 F (37 C) (Oral)   Resp 18   Ht 4\' 11"  (1.499 m)   Wt 173 lb (78.5 kg)   SpO2 100%   BMI 34.94 kg/m       Physical Exam Vitals and  nursing note reviewed.  Constitutional:      General: She is not in acute distress.    Appearance: Normal appearance. She is not ill-appearing or toxic-appearing.  HENT:     Head: Normocephalic and atraumatic.     Nose: Nose normal.     Mouth/Throat:     Mouth: Mucous membranes are moist.     Pharynx: Oropharynx is clear.     Tonsils: Tonsillar exudate (whitish exudates on the left) present. 2+ on the right. 3+ on the left.  Eyes:     General: No scleral icterus.       Right eye: No discharge.        Left eye: No discharge.     Conjunctiva/sclera: Conjunctivae normal.  Cardiovascular:     Rate and Rhythm: Normal rate and regular rhythm.     Heart sounds: Normal heart sounds.  Pulmonary:     Effort: Pulmonary effort is normal. No respiratory distress.     Breath sounds: Normal breath sounds.  Musculoskeletal:     Cervical back: Neck supple.  Skin:    General: Skin is dry.  Neurological:     General: No focal deficit present.     Mental Status: She is alert. Mental status is at baseline.     Motor: No weakness.     Gait: Gait normal.  Psychiatric:        Mood and Affect: Mood normal.        Behavior: Behavior normal.        Thought Content: Thought content normal.      UC Treatments / Results  Labs (all labs ordered are listed, but only abnormal results are displayed) Labs Reviewed  GROUP A STREP BY PCR - Abnormal; Notable for the following components:      Result Value   Group A Strep by PCR DETECTED (*)    All other components within normal limits    EKG   Radiology No results found.  Procedures Procedures (including critical care time)  Medications Ordered in UC Medications  dexamethasone (DECADRON) injection 10 mg (has no administration in time range)    Initial Impression / Assessment and Plan / UC Course  I have reviewed the triage vital signs and the nursing notes.  Pertinent labs & imaging results that were available during my care  of the  patient were reviewed by me and considered in my medical decision making (see chart for details).   25 year old female presenting for sore throat and painful swallowing since yesterday.  All vital signs are normal and stable clinic.  She is overall well-appearing.  Exam significant for 2+ tonsillar enlargement on the right and 3+ enlarged on the left with whitish exudates on the left.  Patient given 10 mg IM dexamethasone due to tonsillar swelling.  Treating with Augmentin and start prednisone x3 days tomorrow.  Also recommended supportive care with increasing rest and fluids, Tylenol for discomfort and close monitoring especially with the swelling.  Advised on ED red flag signs and symptoms.  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   Final Clinical Impressions(s) / UC Diagnoses   Final diagnoses:  Strep tonsillitis     Discharge Instructions     Your strep test was positive.  I have sent amoxicillin to the pharmacy.  You were tonsils are very swollen so you been given an injection of dexamethasone in the clinic.  Have also sent a couple more days of a corticosteroid.  Start prednisone tomorrow, but amoxicillin today. If you have any fevers or increased difficulty swallowing over the next couple days she should return her to ED.    ED Prescriptions    Medication Sig Dispense Auth. Provider   amoxicillin-clavulanate (AUGMENTIN) 875-125 MG tablet Take 1 tablet by mouth every 12 (twelve) hours for 10 days. 20 tablet Eusebio Friendly B, PA-C   predniSONE (DELTASONE) 20 MG tablet Take 2 tablets (40 mg total) by mouth daily for 3 days. 6 tablet Gareth Morgan     PDMP not reviewed this encounter.   Shirlee Latch, PA-C 03/03/21 1334

## 2021-05-16 ENCOUNTER — Encounter: Payer: Medicaid Other | Admitting: Surgical

## 2021-05-17 ENCOUNTER — Encounter: Payer: Medicaid Other | Admitting: Surgical

## 2021-05-17 ENCOUNTER — Encounter (HOSPITAL_BASED_OUTPATIENT_CLINIC_OR_DEPARTMENT_OTHER): Payer: Self-pay | Admitting: Plastic Surgery

## 2021-05-18 ENCOUNTER — Encounter (HOSPITAL_BASED_OUTPATIENT_CLINIC_OR_DEPARTMENT_OTHER): Payer: Self-pay | Admitting: Plastic Surgery

## 2021-05-18 ENCOUNTER — Other Ambulatory Visit: Payer: Self-pay

## 2021-05-19 ENCOUNTER — Ambulatory Visit (INDEPENDENT_AMBULATORY_CARE_PROVIDER_SITE_OTHER): Payer: Medicaid Other | Admitting: Surgical

## 2021-05-19 ENCOUNTER — Encounter: Payer: Self-pay | Admitting: Surgical

## 2021-05-19 VITALS — BP 112/78 | HR 90 | Ht <= 58 in | Wt 172.0 lb

## 2021-05-19 DIAGNOSIS — M546 Pain in thoracic spine: Secondary | ICD-10-CM

## 2021-05-19 DIAGNOSIS — M542 Cervicalgia: Secondary | ICD-10-CM

## 2021-05-19 DIAGNOSIS — N62 Hypertrophy of breast: Secondary | ICD-10-CM

## 2021-05-19 DIAGNOSIS — G8929 Other chronic pain: Secondary | ICD-10-CM

## 2021-05-19 MED ORDER — HYDROCODONE-ACETAMINOPHEN 5-325 MG PO TABS: 1.0000 | ORAL_TABLET | Freq: Four times a day (QID) | ORAL | 0 refills | Status: AC | PRN

## 2021-05-19 MED ORDER — CEPHALEXIN 500 MG PO CAPS: 500.0000 mg | ORAL_CAPSULE | Freq: Four times a day (QID) | ORAL | 0 refills | Status: AC

## 2021-05-19 MED ORDER — ONDANSETRON HCL 4 MG PO TABS: 4.0000 mg | ORAL_TABLET | Freq: Three times a day (TID) | ORAL | 0 refills | Status: DC | PRN

## 2021-05-19 NOTE — H&P (View-Only) (Signed)
Patient ID: Erica Glenn, female    DOB: 1996-11-10, 25 y.o.   MRN: [id number]  Chief Complaint  Patient presents with   Pre-op Exam      ICD-10-CM   1. Symptomatic mammary hypertrophy  N62     2. Chronic bilateral thoracic back pain  M54.6    G89.29     3. Neck pain  M54.2        History of Present Illness: Erica Glenn is a 25 y.o.  female  with a history of macromastia.  She presents for preoperative evaluation for upcoming procedure, Bilateral Breast Reduction, scheduled for 06/01/2021 with Dr.  Ulice Bold  The patient has not had problems with anesthesia. No history of DVT/PE.  Great grandmother and aunt with history of DVT.  No other family history of DVT.  No family or personal history of bleeding or clotting disorders.  Patient is not currently taking any blood thinners.  No history of CVA/MI.   Summary of Previous Visit: STN on the right is 35 cm and the left is 36 cm.  Preoperative bra size is 38G cup and patient would like to be about a B/C cup.  Estimated excess breast tissue to be removed at time of surgery: 550 on the left and 550 on the right.  PMH Significant for: Asthma, well controlled.  No recent illnesses or issues. Patient has Mirena in place  Past Medical History: Allergies: No Known Allergies  Current Medications:  Current Outpatient Medications:    cephALEXin (KEFLEX) 500 MG capsule, Take 1 capsule (500 mg total) by mouth 4 (four) times daily for 3 days., Disp: 12 capsule, Rfl: 0   HYDROcodone-acetaminophen (NORCO) 5-325 MG tablet, Take 1 tablet by mouth every 6 (six) hours as needed for up to 5 days for severe pain., Disp: 20 tablet, Rfl: 0   levonorgestrel (MIRENA) 20 MCG/24HR IUD, 1 each by Intrauterine route once., Disp: , Rfl:    ondansetron (ZOFRAN) 4 MG tablet, Take 1 tablet (4 mg total) by mouth every 8 (eight) hours as needed for nausea or vomiting., Disp: 20 tablet, Rfl: 0   cetirizine (ZYRTEC) 10 MG tablet, Take 10 mg by mouth  daily. (Patient not taking: Reported on 05/19/2021), Disp: , Rfl:    PROAIR HFA 108 (90 Base) MCG/ACT inhaler, Inhale into the lungs., Disp: , Rfl:   Past Medical Problems: Past Medical History:  Diagnosis Date   Asthma    Hx gestational diabetes    Seasonal allergies    Symptomatic mammary hypertrophy     Past Surgical History: Past Surgical History:  Procedure Laterality Date   CHOLECYSTECTOMY  2017    Social History: Social History   Socioeconomic History   Marital status: Single    Spouse name: Not on file   Number of children: Not on file   Years of education: Not on file   Highest education level: Not on file  Occupational History   Not on file  Tobacco Use   Smoking status: Never   Smokeless tobacco: Never  Vaping Use   Vaping Use: Never used  Substance and Sexual Activity   Alcohol use: No   Drug use: No   Sexual activity: Yes    Birth control/protection: I.U.D.  Other Topics Concern   Not on file  Social History Narrative   Not on file   Social Determinants of Health   Financial Resource Strain: Not on file  Food Insecurity: Not on file  Transportation Needs:  Not on file  Physical Activity: Not on file  Stress: Not on file  Social Connections: Not on file  Intimate Partner Violence: Not on file    Family History: Family History  Problem Relation Age of Onset   Healthy Mother    Cirrhosis Father    Alcohol abuse Father     Review of Systems: Review of Systems  Constitutional: Negative.   Respiratory: Negative.    Cardiovascular: Negative.   Gastrointestinal: Negative.   Neurological: Negative.    Physical Exam: Vital Signs BP 112/78 (BP Location: Left Arm, Patient Position: Sitting, Cuff Size: Large)   Pulse 90   Ht 4\' 1"  (1.245 m)   Wt 172 lb (78 kg)   LMP 04/27/2021 (Approximate) Comment: irreg spotting  SpO2 97%   BMI 50.37 kg/m   Physical Exam  Constitutional:      General: Not in acute distress.    Appearance: Normal  appearance. Not ill-appearing.  HENT:     Head: Normocephalic and atraumatic.  Eyes:     Pupils: Pupils are equal, round Neck:     Musculoskeletal: Normal range of motion.  Cardiovascular:     Rate and Rhythm: Normal rate    Pulses: Normal pulses.  Pulmonary:     Effort: Pulmonary effort is normal. No respiratory distress.  Abdominal:     General: Abdomen is flat. There is no distension.  Musculoskeletal: Normal range of motion.  Skin:    General: Skin is warm and dry.     Findings: No erythema or rash.  Neurological:     General: No focal deficit present.     Mental Status: Alert and oriented to person, place, and time. Mental status is at baseline.     Motor: No weakness.  Psychiatric:        Mood and Affect: Mood normal.        Behavior: Behavior normal.    Assessment/Plan: The patient is scheduled for bilateral breast reduction with Dr. 06/27/2021.  Risks, benefits, and alternatives of procedure discussed, questions answered and consent obtained.    Smoking Status: Non-smoker; Counseling Given?  N/A Last Mammogram: No mammographic history; Results: N/A  Caprini Score: 8, high; Risk Factors include: Family history of DVT, BMI greater than 50, Mirena IUD in place, and length of planned surgery. Recommendation for mechanical and pharmacological prophylaxis for 7 to 10 days postoperatively. Encourage early ambulation.   Pictures obtained:[email or web address]   Post-op Rx sent to pharmacy: Norco, Zofran, Keflex  Patient reports she would like to be a C cup.  I did discuss with her that given the size of her breast currently that a C cup may be unlikely to safely achieve given the need to keep an of tissue volume present for a healthy pedicle.  Patient was understanding of this.  Patient was provided with the breast reduction and General Surgical Risk consent document and Pain Medication Agreement prior to their appointment.  They had adequate time to read through the risk consent documents  and Pain Medication Agreement. We also discussed them in person together during this preop appointment. All of their questions were answered to their satisfaction.  Recommended calling if they have any further questions.  Risk consent form and Pain Medication Agreement to be scanned into patient's chart.  The risk that can be encountered with breast reduction were discussed and include the following but not limited to these:  Breast asymmetry, fluid accumulation, firmness of the breast, inability to breast feed, loss of nipple or areola,  skin loss, decrease or no nipple sensation, fat necrosis of the breast tissue, bleeding, infection, healing delay.  There are risks of anesthesia, changes to skin sensation and injury to nerves or blood vessels.  The muscle can be temporarily or permanently injured.  You may have an allergic reaction to tape, suture, glue, blood products which can result in skin discoloration, swelling, pain, skin lesions, poor healing.  Any of these can lead to the need for revisonal surgery or stage procedures.  A reduction has potential to interfere with diagnostic procedures.  Nipple or breast piercing can increase risks of infection.  This procedure is best done when the breast is fully developed.  Changes in the breast will continue to occur over time.  Pregnancy can alter the outcomes of previous breast reduction surgery, weight gain and weigh loss can also effect the long term appearance.   Electronically signed by: Kermit Balo Robb Sibal, PA-C 05/19/2021 9:43 AM

## 2021-05-19 NOTE — Progress Notes (Signed)
Patient ID: Erica Glenn, female    DOB: 1996-11-10, 25 y.o.   MRN: [id number]  Chief Complaint  Patient presents with   Pre-op Exam      ICD-10-CM   1. Symptomatic mammary hypertrophy  N62     2. Chronic bilateral thoracic back pain  M54.6    G89.29     3. Neck pain  M54.2        History of Present Illness: Erica Glenn is a 25 y.o.  female  with a history of macromastia.  She presents for preoperative evaluation for upcoming procedure, Bilateral Breast Reduction, scheduled for 06/01/2021 with Dr.  Ulice Bold  The patient has not had problems with anesthesia. No history of DVT/PE.  Great grandmother and aunt with history of DVT.  No other family history of DVT.  No family or personal history of bleeding or clotting disorders.  Patient is not currently taking any blood thinners.  No history of CVA/MI.   Summary of Previous Visit: STN on the right is 35 cm and the left is 36 cm.  Preoperative bra size is 38G cup and patient would like to be about a B/C cup.  Estimated excess breast tissue to be removed at time of surgery: 550 on the left and 550 on the right.  PMH Significant for: Asthma, well controlled.  No recent illnesses or issues. Patient has Mirena in place  Past Medical History: Allergies: No Known Allergies  Current Medications:  Current Outpatient Medications:    cephALEXin (KEFLEX) 500 MG capsule, Take 1 capsule (500 mg total) by mouth 4 (four) times daily for 3 days., Disp: 12 capsule, Rfl: 0   HYDROcodone-acetaminophen (NORCO) 5-325 MG tablet, Take 1 tablet by mouth every 6 (six) hours as needed for up to 5 days for severe pain., Disp: 20 tablet, Rfl: 0   levonorgestrel (MIRENA) 20 MCG/24HR IUD, 1 each by Intrauterine route once., Disp: , Rfl:    ondansetron (ZOFRAN) 4 MG tablet, Take 1 tablet (4 mg total) by mouth every 8 (eight) hours as needed for nausea or vomiting., Disp: 20 tablet, Rfl: 0   cetirizine (ZYRTEC) 10 MG tablet, Take 10 mg by mouth  daily. (Patient not taking: Reported on 05/19/2021), Disp: , Rfl:    PROAIR HFA 108 (90 Base) MCG/ACT inhaler, Inhale into the lungs., Disp: , Rfl:   Past Medical Problems: Past Medical History:  Diagnosis Date   Asthma    Hx gestational diabetes    Seasonal allergies    Symptomatic mammary hypertrophy     Past Surgical History: Past Surgical History:  Procedure Laterality Date   CHOLECYSTECTOMY  2017    Social History: Social History   Socioeconomic History   Marital status: Single    Spouse name: Not on file   Number of children: Not on file   Years of education: Not on file   Highest education level: Not on file  Occupational History   Not on file  Tobacco Use   Smoking status: Never   Smokeless tobacco: Never  Vaping Use   Vaping Use: Never used  Substance and Sexual Activity   Alcohol use: No   Drug use: No   Sexual activity: Yes    Birth control/protection: I.U.D.  Other Topics Concern   Not on file  Social History Narrative   Not on file   Social Determinants of Health   Financial Resource Strain: Not on file  Food Insecurity: Not on file  Transportation Needs:  Not on file  Physical Activity: Not on file  Stress: Not on file  Social Connections: Not on file  Intimate Partner Violence: Not on file    Family History: Family History  Problem Relation Age of Onset   Healthy Mother    Cirrhosis Father    Alcohol abuse Father     Review of Systems: Review of Systems  Constitutional: Negative.   Respiratory: Negative.    Cardiovascular: Negative.   Gastrointestinal: Negative.   Neurological: Negative.    Physical Exam: Vital Signs BP 112/78 (BP Location: Left Arm, Patient Position: Sitting, Cuff Size: Large)   Pulse 90   Ht 4\' 1"  (1.245 m)   Wt 172 lb (78 kg)   LMP 04/27/2021 (Approximate) Comment: irreg spotting  SpO2 97%   BMI 50.37 kg/m   Physical Exam  Constitutional:      General: Not in acute distress.    Appearance: Normal  appearance. Not ill-appearing.  HENT:     Head: Normocephalic and atraumatic.  Eyes:     Pupils: Pupils are equal, round Neck:     Musculoskeletal: Normal range of motion.  Cardiovascular:     Rate and Rhythm: Normal rate    Pulses: Normal pulses.  Pulmonary:     Effort: Pulmonary effort is normal. No respiratory distress.  Abdominal:     General: Abdomen is flat. There is no distension.  Musculoskeletal: Normal range of motion.  Skin:    General: Skin is warm and dry.     Findings: No erythema or rash.  Neurological:     General: No focal deficit present.     Mental Status: Alert and oriented to person, place, and time. Mental status is at baseline.     Motor: No weakness.  Psychiatric:        Mood and Affect: Mood normal.        Behavior: Behavior normal.    Assessment/Plan: The patient is scheduled for bilateral breast reduction with Dr. 06/27/2021.  Risks, benefits, and alternatives of procedure discussed, questions answered and consent obtained.    Smoking Status: Non-smoker; Counseling Given?  N/A Last Mammogram: No mammographic history; Results: N/A  Caprini Score: 8, high; Risk Factors include: Family history of DVT, BMI greater than 50, Mirena IUD in place, and length of planned surgery. Recommendation for mechanical and pharmacological prophylaxis for 7 to 10 days postoperatively. Encourage early ambulation.   Pictures obtained:@[email or web address]   Post-op Rx sent to pharmacy: Norco, Zofran, Keflex  Patient reports she would like to be a C cup.  I did discuss with her that given the size of her breast currently that a C cup may be unlikely to safely achieve given the need to keep an of tissue volume present for a healthy pedicle.  Patient was understanding of this.  Patient was provided with the breast reduction and General Surgical Risk consent document and Pain Medication Agreement prior to their appointment.  They had adequate time to read through the risk consent documents  and Pain Medication Agreement. We also discussed them in person together during this preop appointment. All of their questions were answered to their satisfaction.  Recommended calling if they have any further questions.  Risk consent form and Pain Medication Agreement to be scanned into patient's chart.  The risk that can be encountered with breast reduction were discussed and include the following but not limited to these:  Breast asymmetry, fluid accumulation, firmness of the breast, inability to breast feed, loss of nipple or areola,  skin loss, decrease or no nipple sensation, fat necrosis of the breast tissue, bleeding, infection, healing delay.  There are risks of anesthesia, changes to skin sensation and injury to nerves or blood vessels.  The muscle can be temporarily or permanently injured.  You may have an allergic reaction to tape, suture, glue, blood products which can result in skin discoloration, swelling, pain, skin lesions, poor healing.  Any of these can lead to the need for revisonal surgery or stage procedures.  A reduction has potential to interfere with diagnostic procedures.  Nipple or breast piercing can increase risks of infection.  This procedure is best done when the breast is fully developed.  Changes in the breast will continue to occur over time.  Pregnancy can alter the outcomes of previous breast reduction surgery, weight gain and weigh loss can also effect the long term appearance.   Electronically signed by: Kermit Balo Ailsa Mireles, PA-C 05/19/2021 9:43 AM

## 2021-05-31 ENCOUNTER — Other Ambulatory Visit (HOSPITAL_COMMUNITY)
Admission: RE | Admit: 2021-05-31 | Discharge: 2021-05-31 | Disposition: A | Discharge status: Home / Self Care | Payer: Medicaid Other | Source: Ambulatory Visit | Attending: Plastic Surgery | Admitting: Plastic Surgery

## 2021-05-31 ENCOUNTER — Other Ambulatory Visit (HOSPITAL_COMMUNITY): Payer: Medicaid Other

## 2021-05-31 DIAGNOSIS — Z01812 Encounter for preprocedural laboratory examination: Secondary | ICD-10-CM | POA: Insufficient documentation

## 2021-05-31 DIAGNOSIS — Z20822 Contact with and (suspected) exposure to covid-19: Secondary | ICD-10-CM | POA: Insufficient documentation

## 2021-06-01 ENCOUNTER — Other Ambulatory Visit: Payer: Self-pay

## 2021-06-01 ENCOUNTER — Ambulatory Visit (HOSPITAL_BASED_OUTPATIENT_CLINIC_OR_DEPARTMENT_OTHER): Payer: Medicaid Other | Admitting: Certified Registered"

## 2021-06-01 ENCOUNTER — Ambulatory Visit (HOSPITAL_BASED_OUTPATIENT_CLINIC_OR_DEPARTMENT_OTHER)
Admission: RE | Admit: 2021-06-01 | Discharge: 2021-06-01 | Disposition: A | Discharge status: Home / Self Care | Payer: Medicaid Other | Attending: Plastic Surgery | Admitting: Plastic Surgery

## 2021-06-01 ENCOUNTER — Encounter (HOSPITAL_BASED_OUTPATIENT_CLINIC_OR_DEPARTMENT_OTHER)
Admission: RE | Disposition: A | Discharge status: Home / Self Care | Payer: Self-pay | Source: Home / Self Care | Attending: Plastic Surgery

## 2021-06-01 ENCOUNTER — Encounter (HOSPITAL_BASED_OUTPATIENT_CLINIC_OR_DEPARTMENT_OTHER): Payer: Self-pay | Admitting: Plastic Surgery

## 2021-06-01 DIAGNOSIS — N62 Hypertrophy of breast: Secondary | ICD-10-CM

## 2021-06-01 DIAGNOSIS — Z793 Long term (current) use of hormonal contraceptives: Secondary | ICD-10-CM | POA: Insufficient documentation

## 2021-06-01 DIAGNOSIS — M546 Pain in thoracic spine: Secondary | ICD-10-CM

## 2021-06-01 DIAGNOSIS — G8929 Other chronic pain: Secondary | ICD-10-CM

## 2021-06-01 DIAGNOSIS — Z79899 Other long term (current) drug therapy: Secondary | ICD-10-CM | POA: Diagnosis not present

## 2021-06-01 DIAGNOSIS — M542 Cervicalgia: Secondary | ICD-10-CM

## 2021-06-01 HISTORY — PX: BREAST REDUCTION SURGERY: SHX8

## 2021-06-01 HISTORY — DX: Personal history of gestational diabetes: Z86.32

## 2021-06-01 HISTORY — DX: Hypertrophy of breast: N62

## 2021-06-01 LAB — POCT PREGNANCY, URINE: Preg Test, Ur: NEGATIVE

## 2021-06-01 LAB — SARS CORONAVIRUS 2 (TAT 6-24 HRS): SARS Coronavirus 2: NEGATIVE

## 2021-06-01 SURGERY — BREAST REDUCTION WITH LIPOSUCTION
Anesthesia: General | Site: Breast | Laterality: Bilateral

## 2021-06-01 MED ORDER — HYDROMORPHONE HCL 1 MG/ML IJ SOLN
0.2500 mg | INTRAMUSCULAR | Status: DC | PRN
  Administered 2021-06-01: 0.5 mg via INTRAVENOUS

## 2021-06-01 MED ORDER — LACTATED RINGERS IV SOLN: INTRAVENOUS | Status: DC | PRN

## 2021-06-01 MED ORDER — ACETAMINOPHEN 325 MG PO TABS: 650.0000 mg | ORAL_TABLET | ORAL | Status: DC | PRN

## 2021-06-01 MED ORDER — LACTATED RINGERS IV SOLN: INTRAVENOUS | Status: DC

## 2021-06-01 MED ORDER — SODIUM CHLORIDE 0.9 % IV SOLN: 250.0000 mL | INTRAVENOUS | Status: DC | PRN

## 2021-06-01 MED ORDER — ONDANSETRON HCL 4 MG/2ML IJ SOLN
INTRAMUSCULAR | Status: AC
  Filled 2021-06-01: qty 2

## 2021-06-01 MED ORDER — EPINEPHRINE PF 1 MG/ML IJ SOLN
INTRAMUSCULAR | Status: AC
  Filled 2021-06-01: qty 1

## 2021-06-01 MED ORDER — LIDOCAINE HCL (CARDIAC) PF 100 MG/5ML IV SOSY
PREFILLED_SYRINGE | INTRAVENOUS | Status: DC | PRN
  Administered 2021-06-01: 100 mg via INTRAVENOUS

## 2021-06-01 MED ORDER — FENTANYL CITRATE (PF) 100 MCG/2ML IJ SOLN
INTRAMUSCULAR | Status: DC | PRN
  Administered 2021-06-01: 50 ug via INTRAVENOUS
  Administered 2021-06-01: 25 ug via INTRAVENOUS
  Administered 2021-06-01: 100 ug via INTRAVENOUS
  Administered 2021-06-01: 25 ug via INTRAVENOUS
  Administered 2021-06-01: 100 ug via INTRAVENOUS

## 2021-06-01 MED ORDER — SODIUM CHLORIDE 0.9% FLUSH: 3.0000 mL | INTRAVENOUS | Status: DC | PRN

## 2021-06-01 MED ORDER — OXYCODONE HCL 5 MG PO TABS
5.0000 mg | ORAL_TABLET | Freq: Once | ORAL | Status: AC | PRN
  Administered 2021-06-01: 5 mg via ORAL

## 2021-06-01 MED ORDER — LIDOCAINE-EPINEPHRINE 1 %-1:100000 IJ SOLN
INTRAMUSCULAR | Status: DC | PRN
  Administered 2021-06-01: 50 mL via INTRAMUSCULAR

## 2021-06-01 MED ORDER — PROPOFOL 500 MG/50ML IV EMUL
INTRAVENOUS | Status: DC | PRN
  Administered 2021-06-01: 25 ug/kg/min via INTRAVENOUS

## 2021-06-01 MED ORDER — BUPIVACAINE HCL (PF) 0.25 % IJ SOLN
INTRAMUSCULAR | Status: AC
  Filled 2021-06-01: qty 30

## 2021-06-01 MED ORDER — CEFAZOLIN SODIUM-DEXTROSE 2-4 GM/100ML-% IV SOLN: 2.0000 g | INTRAVENOUS | Status: DC

## 2021-06-01 MED ORDER — PROPOFOL 10 MG/ML IV BOLUS
INTRAVENOUS | Status: AC
  Filled 2021-06-01: qty 20

## 2021-06-01 MED ORDER — MIDAZOLAM HCL 5 MG/5ML IJ SOLN
INTRAMUSCULAR | Status: DC | PRN
  Administered 2021-06-01: 2 mg via INTRAVENOUS

## 2021-06-01 MED ORDER — SUGAMMADEX SODIUM 200 MG/2ML IV SOLN
INTRAVENOUS | Status: DC | PRN
  Administered 2021-06-01: 200 mg via INTRAVENOUS

## 2021-06-01 MED ORDER — FENTANYL CITRATE (PF) 100 MCG/2ML IJ SOLN: 25.0000 ug | INTRAMUSCULAR | Status: DC | PRN

## 2021-06-01 MED ORDER — ROCURONIUM BROMIDE 10 MG/ML (PF) SYRINGE
PREFILLED_SYRINGE | INTRAVENOUS | Status: AC
  Filled 2021-06-01: qty 10

## 2021-06-01 MED ORDER — ACETAMINOPHEN 325 MG RE SUPP: 650.0000 mg | RECTAL | Status: DC | PRN

## 2021-06-01 MED ORDER — FENTANYL CITRATE (PF) 100 MCG/2ML IJ SOLN
INTRAMUSCULAR | Status: AC
  Filled 2021-06-01: qty 2

## 2021-06-01 MED ORDER — DEXAMETHASONE SODIUM PHOSPHATE 4 MG/ML IJ SOLN
INTRAMUSCULAR | Status: DC | PRN
  Administered 2021-06-01: 5 mg via INTRAVENOUS

## 2021-06-01 MED ORDER — MIDAZOLAM HCL 2 MG/2ML IJ SOLN
INTRAMUSCULAR | Status: AC
  Filled 2021-06-01: qty 2

## 2021-06-01 MED ORDER — OXYCODONE HCL 5 MG PO TABS: 5.0000 mg | ORAL_TABLET | ORAL | Status: DC | PRN

## 2021-06-01 MED ORDER — MEPERIDINE HCL 25 MG/ML IJ SOLN: 6.2500 mg | INTRAMUSCULAR | Status: DC | PRN

## 2021-06-01 MED ORDER — PHENYLEPHRINE HCL (PRESSORS) 10 MG/ML IV SOLN
INTRAVENOUS | Status: DC | PRN
  Administered 2021-06-01: 80 ug via INTRAVENOUS

## 2021-06-01 MED ORDER — AMISULPRIDE (ANTIEMETIC) 5 MG/2ML IV SOLN: 10.0000 mg | Freq: Once | INTRAVENOUS | Status: DC | PRN

## 2021-06-01 MED ORDER — METOPROLOL TARTRATE 5 MG/5ML IV SOLN
INTRAVENOUS | Status: DC | PRN
  Administered 2021-06-01 (×2): 1 mg via INTRAVENOUS

## 2021-06-01 MED ORDER — LIDOCAINE HCL 1 % IJ SOLN
INTRAVENOUS | Status: DC | PRN
  Administered 2021-06-01: 500 mL

## 2021-06-01 MED ORDER — HYDROMORPHONE HCL 1 MG/ML IJ SOLN
INTRAMUSCULAR | Status: AC
  Filled 2021-06-01: qty 0.5

## 2021-06-01 MED ORDER — OXYCODONE HCL 5 MG/5ML PO SOLN: 5.0000 mg | Freq: Once | ORAL | Status: AC | PRN

## 2021-06-01 MED ORDER — DEXAMETHASONE SODIUM PHOSPHATE 10 MG/ML IJ SOLN
INTRAMUSCULAR | Status: AC
  Filled 2021-06-01: qty 1

## 2021-06-01 MED ORDER — CEFAZOLIN SODIUM-DEXTROSE 2-4 GM/100ML-% IV SOLN
INTRAVENOUS | Status: AC
  Filled 2021-06-01: qty 100

## 2021-06-01 MED ORDER — LIDOCAINE HCL (PF) 1 % IJ SOLN
INTRAMUSCULAR | Status: AC
  Filled 2021-06-01: qty 30

## 2021-06-01 MED ORDER — LIDOCAINE-EPINEPHRINE 1 %-1:100000 IJ SOLN
INTRAMUSCULAR | Status: AC
  Filled 2021-06-01: qty 1

## 2021-06-01 MED ORDER — ROCURONIUM BROMIDE 100 MG/10ML IV SOLN
INTRAVENOUS | Status: DC | PRN
  Administered 2021-06-01: 70 mg via INTRAVENOUS

## 2021-06-01 MED ORDER — SODIUM CHLORIDE 0.9% FLUSH: 3.0000 mL | Freq: Two times a day (BID) | INTRAVENOUS | Status: DC

## 2021-06-01 MED ORDER — OXYCODONE HCL 5 MG PO TABS
ORAL_TABLET | ORAL | Status: AC
  Filled 2021-06-01: qty 1

## 2021-06-01 MED ORDER — ONDANSETRON HCL 4 MG/2ML IJ SOLN
INTRAMUSCULAR | Status: DC | PRN
  Administered 2021-06-01: 4 mg via INTRAVENOUS

## 2021-06-01 MED ORDER — PROMETHAZINE HCL 25 MG/ML IJ SOLN: 6.2500 mg | INTRAMUSCULAR | Status: DC | PRN

## 2021-06-01 MED ORDER — CHLORHEXIDINE GLUCONATE CLOTH 2 % EX PADS: 6.0000 | MEDICATED_PAD | Freq: Once | CUTANEOUS | Status: DC

## 2021-06-01 MED ORDER — PROPOFOL 10 MG/ML IV BOLUS
INTRAVENOUS | Status: DC | PRN
  Administered 2021-06-01: 150 mg via INTRAVENOUS

## 2021-06-01 MED ORDER — LIDOCAINE HCL (PF) 2 % IJ SOLN
INTRAMUSCULAR | Status: AC
  Filled 2021-06-01: qty 5

## 2021-06-01 MED ORDER — PROPOFOL 10 MG/ML IV BOLUS
INTRAVENOUS | Status: AC
  Filled 2021-06-01: qty 40

## 2021-06-01 SURGICAL SUPPLY — 67 items
ADH SKN CLS APL DERMABOND .7 (GAUZE/BANDAGES/DRESSINGS) ×2
BAG DECANTER FOR FLEXI CONT (MISCELLANEOUS) IMPLANT
BINDER BREAST LRG (GAUZE/BANDAGES/DRESSINGS) ×2 IMPLANT
BINDER BREAST MEDIUM (GAUZE/BANDAGES/DRESSINGS) IMPLANT
BINDER BREAST XLRG (GAUZE/BANDAGES/DRESSINGS) IMPLANT
BINDER BREAST XXLRG (GAUZE/BANDAGES/DRESSINGS) IMPLANT
BIOPATCH RED 1 DISK 7.0 (GAUZE/BANDAGES/DRESSINGS) IMPLANT
BLADE HEX COATED 2.75 (ELECTRODE) ×2 IMPLANT
BLADE KNIFE PERSONA 10 (BLADE) ×4 IMPLANT
BLADE SURG 15 STRL LF DISP TIS (BLADE) ×1 IMPLANT
BLADE SURG 15 STRL SS (BLADE) ×2
BNDG GAUZE ELAST 4 BULKY (GAUZE/BANDAGES/DRESSINGS) IMPLANT
CANISTER SUCT 1200ML W/VALVE (MISCELLANEOUS) ×4 IMPLANT
COVER BACK TABLE 60X90IN (DRAPES) ×2 IMPLANT
COVER MAYO STAND STRL (DRAPES) ×2 IMPLANT
DECANTER SPIKE VIAL GLASS SM (MISCELLANEOUS) IMPLANT
DERMABOND ADVANCED (GAUZE/BANDAGES/DRESSINGS) ×2
DERMABOND ADVANCED .7 DNX12 (GAUZE/BANDAGES/DRESSINGS) ×2 IMPLANT
DRAIN CHANNEL 19F RND (DRAIN) IMPLANT
DRAPE LAPAROSCOPIC ABDOMINAL (DRAPES) ×2 IMPLANT
DRSG OPSITE POSTOP 4X6 (GAUZE/BANDAGES/DRESSINGS) ×2 IMPLANT
DRSG PAD ABDOMINAL 8X10 ST (GAUZE/BANDAGES/DRESSINGS) ×4 IMPLANT
ELECT BLADE 4.0 EZ CLEAN MEGAD (MISCELLANEOUS) ×4
ELECT REM PT RETURN 9FT ADLT (ELECTROSURGICAL) ×2
ELECTRODE BLDE 4.0 EZ CLN MEGD (MISCELLANEOUS) ×2 IMPLANT
ELECTRODE REM PT RTRN 9FT ADLT (ELECTROSURGICAL) ×1 IMPLANT
EVACUATOR SILICONE 100CC (DRAIN) IMPLANT
GLOVE SURG ENC MOIS LTX SZ6.5 (GLOVE) ×8 IMPLANT
GLOVE SURG ENC MOIS LTX SZ7.5 (GLOVE) ×2 IMPLANT
GOWN STRL REUS W/ TWL LRG LVL3 (GOWN DISPOSABLE) ×3 IMPLANT
GOWN STRL REUS W/TWL LRG LVL3 (GOWN DISPOSABLE) ×6
NDL SAFETY ECLIPSE 18X1.5 (NEEDLE) IMPLANT
NEEDLE FILTER BLUNT 18X 1/2SAF (NEEDLE) ×2
NEEDLE FILTER BLUNT 18X1 1/2 (NEEDLE) ×2 IMPLANT
NEEDLE HYPO 18GX1.5 SHARP (NEEDLE)
NEEDLE HYPO 25X1 1.5 SAFETY (NEEDLE) ×2 IMPLANT
NS IRRIG 1000ML POUR BTL (IV SOLUTION) ×2 IMPLANT
PACK BASIN DAY SURGERY FS (CUSTOM PROCEDURE TRAY) ×2 IMPLANT
PAD ALCOHOL SWAB (MISCELLANEOUS) ×2 IMPLANT
PAD FOAM SILICONE BACKED (GAUZE/BANDAGES/DRESSINGS) IMPLANT
PENCIL SMOKE EVACUATOR (MISCELLANEOUS) ×4 IMPLANT
PIN SAFETY STERILE (MISCELLANEOUS) IMPLANT
SLEEVE SCD COMPRESS KNEE MED (STOCKING) ×2 IMPLANT
SPONGE T-LAP 18X18 ~~LOC~~+RFID (SPONGE) ×4 IMPLANT
STRIP SUTURE WOUND CLOSURE 1/2 (MISCELLANEOUS) ×4 IMPLANT
SUT MNCRL AB 4-0 PS2 18 (SUTURE) ×16 IMPLANT
SUT MON AB 3-0 SH 27 (SUTURE) ×16
SUT MON AB 3-0 SH27 (SUTURE) ×8 IMPLANT
SUT MON AB 5-0 PS2 18 (SUTURE) ×4 IMPLANT
SUT PDS 3-0 CT2 (SUTURE) ×4
SUT PDS AB 2-0 CT2 27 (SUTURE) IMPLANT
SUT PDS II 3-0 CT2 27 ABS (SUTURE) ×2 IMPLANT
SUT SILK 3 0 PS 1 (SUTURE) IMPLANT
SUT VIC AB 3-0 SH 27 (SUTURE)
SUT VIC AB 3-0 SH 27X BRD (SUTURE) IMPLANT
SUT VICRYL 4-0 PS2 18IN ABS (SUTURE) IMPLANT
SYR 50ML LL SCALE MARK (SYRINGE) IMPLANT
SYR BULB IRRIG 60ML STRL (SYRINGE) ×2 IMPLANT
SYR CONTROL 10ML LL (SYRINGE) ×2 IMPLANT
TAPE MEASURE VINYL STERILE (MISCELLANEOUS) IMPLANT
TOWEL GREEN STERILE FF (TOWEL DISPOSABLE) ×4 IMPLANT
TRAY DSU PREP LF (CUSTOM PROCEDURE TRAY) ×2 IMPLANT
TUBE CONNECTING 20X1/4 (TUBING) ×2 IMPLANT
TUBING INFILTRATION IT-10001 (TUBING) ×2 IMPLANT
TUBING SET GRADUATE ASPIR 12FT (MISCELLANEOUS) ×2 IMPLANT
UNDERPAD 30X36 HEAVY ABSORB (UNDERPADS AND DIAPERS) ×4 IMPLANT
YANKAUER SUCT BULB TIP NO VENT (SUCTIONS) ×2 IMPLANT

## 2021-06-01 NOTE — Anesthesia Postprocedure Evaluation (Signed)
Anesthesia Post Note  Patient: Erica Glenn  Procedure(s) Performed: BREAST REDUCTION WITH LIPOSUCTION (Bilateral: Breast)     Patient location during evaluation: PACU Anesthesia Type: General Level of consciousness: awake and alert Pain management: pain level controlled Vital Signs Assessment: post-procedure vital signs reviewed and stable Respiratory status: spontaneous breathing, nonlabored ventilation and respiratory function stable Cardiovascular status: blood pressure returned to baseline and stable Postop Assessment: no apparent nausea or vomiting Anesthetic complications: no   No notable events documented.  Last Vitals:  Vitals:   06/01/21 1300 06/01/21 1315  BP: 121/77 125/83  Pulse: 99 97  Resp: 18 14  Temp:    SpO2: 94% 95%    Last Pain:  Vitals:   06/01/21 1327  TempSrc:   PainSc: 4                  Lowella Curb

## 2021-06-01 NOTE — Discharge Instructions (Addendum)
INSTRUCTIONS FOR AFTER SURGERY   You will likely have some questions about what to expect following your operation.  The following information will help you and your family understand what to expect when you are discharged from the hospital.  Following these guidelines will help ensure a smooth recovery and reduce risks of complications.  Postoperative instructions include information on: diet, wound care, medications and physical activity.  AFTER SURGERY Expect to go home after the procedure.  In some cases, you may need to spend one night in the hospital for observation.  DIET This surgery does not require a specific diet.  However, I have to mention that the healthier you eat the better your body can start healing. It is important to increasing your protein intake.  This means limiting the foods with added sugar.  Focus on fruits and vegetables and some meat. It is very important to drink water after your surgery.  If your urine is bright yellow, then it is concentrated, and you need to drink more water.  As a general rule after surgery, you should have 8 ounces of water every hour while awake.  If you find you are persistently nauseated or unable to take in liquids let us know.  NO TOBACCO USE or EXPOSURE.  This will slow your healing process and increase the risk of a wound.  WOUND CARE If you don't have a drain: You can shower the day after surgery.  Use fragrance free soap.  Dial, Dove, Ivory and Cetaphil are usually mild on the skin.  If you have a drain: Clean with baby wipes for 3-5 days and then you can shower.  If you have a binder you may remove it to shower and then put it back on. If you have steri-strips / tape directly attached to your skin leave them in place. It is OK to get these wet.  No baths, pools or hot tubs for two weeks. We close your incision to leave the smallest and best-looking scar. No ointment or creams on your incisions until given the go ahead.  Especially not  Neosporin (Too many skin reactions with this one).  A few weeks after surgery you can use Mederma and start massaging the scar. We ask you to wear your binder or sports bra for the first 6 weeks around the clock, including while sleeping. This provides added comfort and helps reduce the fluid accumulation at the surgery site.  ACTIVITY No heavy lifting until cleared by the doctor.  It is OK to walk and climb stairs. In fact, moving your legs is very important to decrease your risk of a blood clot.  It will also help keep you from getting deconditioned.  Every 1 to 2 hours get up and walk for 5 minutes. This will help with a quicker recovery back to normal.  Let pain be your guide so you don't do too much.  NO, you cannot do the spring cleaning and don't plan on taking care of anyone else.  This is your time for TLC.   WORK Everyone returns to work at different times. As a rough guide, most people take at least 1 - 2 weeks off prior to returning to work. If you need documentation for your job, bring the forms to your postoperative follow up visit.  DRIVING Arrange for someone to bring you home from the hospital.  You may be able to drive a few days after surgery but not while taking any narcotics or valium.  BOWEL   MOVEMENTS Constipation can occur after anesthesia and while taking pain medication.  It is important to stay ahead for your comfort.  We recommend taking Milk of Magnesia (2 tablespoons; twice a day) while taking the pain pills.  SEROMA This is fluid your body tried to put in the surgical site.  This is normal but if it creates excessive pain and swelling let us know.  It usually decreases in a few weeks.  MEDICATIONS and PAIN CONTROL At your preoperative visit for you history and physical you were given medications.  WHEN TO CALL Call your surgeon's office if any of the following occur:  Fever 101 degrees F or greater  Excessive bleeding or fluid from the incision site.  Pain that  increases over time without aid from the medications  Redness, warmth, or pus draining from incision sites  Persistent nausea or inability to take in liquids  Severe misshapen area that underwent the operation.    Post Anesthesia Home Care Instructions  Activity: Get plenty of rest for the remainder of the day. A responsible individual must stay with you for 24 hours following the procedure.  For the next 24 hours, DO NOT: -Drive a car -Advertising copywriter -Drink alcoholic beverages -Take any medication unless instructed by your physician -Make any legal decisions or sign important papers.  Meals: Start with liquid foods such as gelatin or soup. Progress to regular foods as tolerated. Avoid greasy, spicy, heavy foods. If nausea and/or vomiting occur, drink only clear liquids until the nausea and/or vomiting subsides. Call your physician if vomiting continues.  Special Instructions/Symptoms: Your throat may feel dry or sore from the anesthesia or the breathing tube placed in your throat during surgery. If this causes discomfort, gargle with warm salt water. The discomfort should disappear within 24 hours.  If you had a scopolamine patch placed behind your ear for the management of post- operative nausea and/or vomiting:  1. The medication in the patch is effective for 72 hours, after which it should be removed.  Wrap patch in a tissue and discard in the trash. Wash hands thoroughly with soap and water. 2. You may remove the patch earlier than 72 hours if you experience unpleasant side effects which may include dry mouth, dizziness or visual disturbances. 3. Avoid touching the patch. Wash your hands with soap and water after contact with the patch.

## 2021-06-01 NOTE — Interval H&P Note (Signed)
History and Physical Interval Note:  06/01/2021 8:58 AM  Erica Glenn  has presented today for surgery, with the diagnosis of mammry hypertrophy.  The various methods of treatment have been discussed with the patient and family. After consideration of risks, benefits and other options for treatment, the patient has consented to  Procedure(s) with comments: BREAST REDUCTION WITH LIPOSUCTION (Bilateral) - 3 hours as a surgical intervention.  The patient's history has been reviewed, patient examined, no change in status, stable for surgery.  I have reviewed the patient's chart and labs.  Questions were answered to the patient's satisfaction.     Alena Bills Masie Bermingham

## 2021-06-01 NOTE — Op Note (Signed)
Breast Reduction Op note:    DATE OF PROCEDURE: 06/01/2021  LOCATION: [facility]  SURGEON: Alan Ripper Sanger Cardarius Senat, DO  ASSISTANT: Keenan Bachelor, PA  PREOPERATIVE DIAGNOSIS 1. Macromastia 2. Neck Pain 3. Back Pain  POSTOPERATIVE DIAGNOSIS 1. Macromastia 2. Neck Pain 3. Back Pain  PROCEDURES 1. Bilateral breast reduction.  Right reduction 1059 g, Left reduction 1350 g  COMPLICATIONS: None.  DRAINS: none  INDICATIONS FOR PROCEDURE Erica Glenn is a 25 y.o. year-old female born on 1996/06/23,with a history of symptomatic macromastia with concominant back pain, neck pain, shoulder grooving from her bra.   MRN: [id number]  CONSENT Informed consent was obtained directly from the patient. The risks, benefits and alternatives were fully discussed. Specific risks including but not limited to bleeding, infection, hematoma, seroma, scarring, pain, nipple necrosis, asymmetry, poor cosmetic results, and need for further surgery were discussed. The patient had ample opportunity to have her questions answered to her satisfaction.  DESCRIPTION OF PROCEDURE  Patient was brought into the operating room and placed in a supine position.  SCDs were placed and appropriate padding was performed.  Antibiotics were given. The patient underwent general anesthesia and the chest was prepped and draped in a sterile fashion.  A timeout was performed and all information was confirmed to be correct.  Tumescent was placed in the lateral aspect of both breasts.  Right side: Preoperative markings were confirmed.  Incision lines were injected with local with epinephrine.  After waiting for vasoconstriction, the marked lines were incised.  A Wise-pattern superomedial breast reduction was performed by de-epithelializing the pedicle, using bovie to create the superomedial pedicle, and removing breast tissue from the lateral and inferior portions of the breast.  Care was taken to not  undermine the breast pedicle. Hemostasis was achieved. Liposuction was done in the lateral aspect of the breast. The nipple was gently rotated into position and the soft tissue closed with 4-0 Monocryl.   The pocket was irrigated and hemostasis confirmed.  The deep tissues were approximated with 3-0 PDS and Monocryl sutures and the skin was closed with deep dermal and subcuticular 4-0 Monocryl sutures.  The nipple and skin flaps had good capillary refill at the end of the procedure.    Left side: Preoperative markings were confirmed.  Incision lines were injected with local with epinephrine.  After waiting for vasoconstriction, the marked lines were incised.  A Wise-pattern superomedial breast reduction was performed by de-epithelializing the pedicle, using bovie to create the superomedial pedicle, and removing breast tissue from the lateral and inferior portions of the breast.  Care was taken to not undermine the breast pedicle. Hemostasis was achieved.  Liposuction was performed in the lateral aspect of the breast. The nipple was gently rotated into position and the soft tissue was closed with 4-0 Monocryl.  The patient was sat upright and size and shape symmetry was confirmed.  The pocket was irrigated and hemostasis confirmed.  The deep tissues were approximated with 3-0 PDS and Monocryl sutures and the skin was closed with deep dermal and subcuticular 4-0 Monocryl sutures.  Dermabond was applied.  A breast binder and ABDs were placed.  The nipple and skin flaps had good capillary refill at the end of the procedure.  The patient tolerated the procedure well. The patient was allowed to wake from anesthesia and taken to the recovery room in satisfactory condition.  The advanced practice practitioner (APP) assisted throughout the case.  The APP was essential in retraction and counter traction  when needed to make the case progress smoothly.  This retraction and assistance made it possible to see the tissue plans  for the procedure.  The assistance was needed for blood control, tissue re-approximation and assisted with closure of the incision site.

## 2021-06-01 NOTE — Anesthesia Preprocedure Evaluation (Addendum)
Anesthesia Evaluation  Patient identified by MRN, date of birth, ID band Patient awake    Reviewed: Allergy & Precautions, H&P , NPO status , Patient's Chart, lab work & pertinent test results, reviewed documented beta blocker date and time   History of Anesthesia Complications Negative for: history of anesthetic complications  Airway Mallampati: III  TM Distance: >3 FB Neck ROM: full    Dental no notable dental hx. (+) Teeth Intact   Pulmonary neg shortness of breath, asthma , neg sleep apnea, neg recent URI,    Pulmonary exam normal breath sounds clear to auscultation       Cardiovascular Exercise Tolerance: Good negative cardio ROS Normal cardiovascular exam Rhythm:regular Rate:Normal     Neuro/Psych negative neurological ROS  negative psych ROS   GI/Hepatic negative GI ROS, Neg liver ROS,   Endo/Other  negative endocrine ROS  Renal/GU negative Renal ROS  negative genitourinary   Musculoskeletal   Abdominal (+) + obese,   Peds  Hematology negative hematology ROS (+)   Anesthesia Other Findings Past Medical History:   H/O gestational diabetes in prior pregnancy, c* 2015           Comment:Scolosis   Asthma                                                       Reproductive/Obstetrics negative OB ROS                            Anesthesia Physical  Anesthesia Plan  ASA: II  Anesthesia Plan: General   Post-op Pain Management:    Induction: Intravenous  PONV Risk Score and Plan: 3 and Ondansetron, Dexamethasone, Midazolam and Treatment may vary due to age or medical condition  Airway Management Planned: Oral ETT  Additional Equipment:   Intra-op Plan:   Post-operative Plan: Extubation in OR  Informed Consent: I have reviewed the patients History and Physical, chart, labs and discussed the procedure including the risks, benefits and alternatives for the proposed anesthesia  with the patient or authorized representative who has indicated his/her understanding and acceptance.     Dental Advisory Given  Plan Discussed with: Anesthesiologist, CRNA and Surgeon  Anesthesia Plan Comments:        Anesthesia Quick Evaluation

## 2021-06-01 NOTE — Transfer of Care (Signed)
Immediate Anesthesia Transfer of Care Note  Patient: Erica Glenn  Procedure(s) Performed: BREAST REDUCTION WITH LIPOSUCTION (Bilateral: Breast)  Patient Location: PACU  Anesthesia Type:General  Level of Consciousness: awake, alert  and oriented  Airway & Oxygen Therapy: Patient Spontanous Breathing and Patient connected to face mask oxygen  Post-op Assessment: Report given to RN and Post -op Vital signs reviewed and stable  Post vital signs: Reviewed and stable  Last Vitals:  Vitals Value Taken Time  BP    Temp    Pulse    Resp    SpO2      Last Pain:  Vitals:   06/01/21 0749  TempSrc: Oral  PainSc: 0-No pain      Patients Stated Pain Goal: 4 (06/01/21 0749)  Complications: No notable events documented.

## 2021-06-01 NOTE — Anesthesia Procedure Notes (Signed)
Procedure Name: Intubation Date/Time: 06/01/2021 12:19 PM Performed by: Verita Lamb, CRNA Pre-anesthesia Checklist: Patient identified, Emergency Drugs available, Suction available and Patient being monitored Patient Re-evaluated:Patient Re-evaluated prior to induction Oxygen Delivery Method: Circle system utilized Preoxygenation: Pre-oxygenation with 100% oxygen Induction Type: IV induction Ventilation: Mask ventilation without difficulty Laryngoscope Size: Mac and 4 Tube type: Oral Tube size: 7.0 mm Number of attempts: 1 Airway Equipment and Method: Stylet and Oral airway Placement Confirmation: ETT inserted through vocal cords under direct vision, positive ETCO2, breath sounds checked- equal and bilateral and CO2 detector Secured at: 22 cm Tube secured with: Tape Dental Injury: Teeth and Oropharynx as per pre-operative assessment

## 2021-06-02 LAB — SURGICAL PATHOLOGY

## 2021-06-03 ENCOUNTER — Encounter (HOSPITAL_BASED_OUTPATIENT_CLINIC_OR_DEPARTMENT_OTHER): Payer: Self-pay | Admitting: Plastic Surgery

## 2021-06-06 ENCOUNTER — Other Ambulatory Visit: Payer: Self-pay | Admitting: Surgical

## 2021-06-06 ENCOUNTER — Telehealth: Payer: Self-pay | Admitting: Plastic Surgery

## 2021-06-06 MED ORDER — HYDROCODONE-ACETAMINOPHEN 5-325 MG PO TABS: 1.0000 | ORAL_TABLET | Freq: Four times a day (QID) | ORAL | 0 refills | Status: AC | PRN

## 2021-06-06 NOTE — Telephone Encounter (Signed)
Returned mother's call. The patient has been alternating Tylenol, IBU with Hydrocodone and is still in severe pain in the BL axillary area. BL breast reduction with liposuction was performed on 06/01/2021 with Dr. Ulice Bold. Advised mother the liposuction is a very painful procedure and recovery. She can apply a cold compress on the tender area, but not directly on the incision. Mother indicated patient has been doing this as well. Explained it is not a normal protocol at our office that we refill pain medication via phone, but will let the PA the situation.

## 2021-06-06 NOTE — Telephone Encounter (Signed)
Called mother back, advised that I spoke with [facility]. A prescription of hydrocodone for 2 days has been sent in. Continue alternating Tylenol with IBU and only take the hydrocodone for severe pain. We will see patient this Friday for her PO follow up. Mother understood and agreed with plan.

## 2021-06-06 NOTE — Telephone Encounter (Signed)
Patient's mother called to advise that she is still is in a lot of pain and only has 2 hydrocodone left. She has been alternating that with advil and tylenol. She is crying from the amount of pain. Please call to advise if she is able to get a refill on pain med. [phone]

## 2021-06-10 ENCOUNTER — Encounter: Payer: Self-pay | Admitting: Plastic Surgery

## 2021-06-10 ENCOUNTER — Other Ambulatory Visit: Payer: Self-pay

## 2021-06-10 ENCOUNTER — Ambulatory Visit (INDEPENDENT_AMBULATORY_CARE_PROVIDER_SITE_OTHER): Payer: Medicaid Other | Admitting: Plastic Surgery

## 2021-06-10 DIAGNOSIS — N62 Hypertrophy of breast: Secondary | ICD-10-CM

## 2021-06-10 NOTE — Progress Notes (Signed)
   Subjective:    Patient ID: Erica Glenn, female    DOB: 11-10-1996, 25 y.o.   MRN: [id number]  The patient is a 25 year old female here for follow-up after undergoing bilateral breast reduction.  She had over 1000 g removed from each breast over a week ago.  She is doing extremely well and feeling without any difficulty.  Her dressings are still in place.  She does have a fair bit of swelling still.  This will take another week or so to resolve.  I think her binder is a little bit tight.  I recommend she go ahead and get a sports bra.     Review of Systems  Constitutional: Negative.   Eyes: Negative.   Respiratory: Negative.    Cardiovascular: Negative.   Endocrine: Negative.   Genitourinary: Negative.       Objective:   Physical Exam Vitals and nursing note reviewed.  Constitutional:      Appearance: Normal appearance.  HENT:     Head: Normocephalic and atraumatic.  Cardiovascular:     Rate and Rhythm: Normal rate.     Pulses: Normal pulses.  Pulmonary:     Effort: Pulmonary effort is normal.  Skin:    General: Skin is warm.  Neurological:     Mental Status: She is alert.        Assessment & Plan:     ICD-10-CM   1. Symptomatic mammary hypertrophy  N62

## 2021-06-13 ENCOUNTER — Other Ambulatory Visit: Payer: Self-pay

## 2021-06-13 ENCOUNTER — Ambulatory Visit (INDEPENDENT_AMBULATORY_CARE_PROVIDER_SITE_OTHER): Payer: Medicaid Other | Admitting: Surgical

## 2021-06-13 DIAGNOSIS — G8929 Other chronic pain: Secondary | ICD-10-CM

## 2021-06-13 DIAGNOSIS — N62 Hypertrophy of breast: Secondary | ICD-10-CM

## 2021-06-13 DIAGNOSIS — M546 Pain in thoracic spine: Secondary | ICD-10-CM

## 2021-06-13 DIAGNOSIS — M542 Cervicalgia: Secondary | ICD-10-CM

## 2021-06-13 NOTE — Progress Notes (Signed)
Patient is a 25 year old female here for follow-up after bilateral breast reduction with Dr. Ulice Bold on 06/01/2021.  She is here today for some concerns related to her reduction.  She is nearly 2 weeks postop.  She reports that she noticed some swelling of her left breast and increased pain on the left side over the past few days, she reports that she is not having any infectious symptoms.  She has been wearing a sports bra.  She is here today with her mother.  Chaperone present on exam On exam bilateral breast incisions are intact, bilateral NAC's are viable, bilateral breasts are swollen, left breast is more swollen and is firm.  Mild bruising is noted.  No drainage noted on bilateral honeycomb dressings noted.  No erythema or cellulitic changes are noted.  250 cc of dark sanguinous fluid was aspirated from the left breast, patient tolerated this fine.  Sterile technique was used.  Left breast was much softer after aspiration.  Recommend continue to wear compressive garment, discussed with patient she had a small hematoma which may have developed after switching from compression garment to a sports bra.  Recommend continuing to monitor and following up at her next scheduled appointment on 06/21/2021.  Discussed with patient if her symptoms worsen or change we can certainly see her sooner.  Patient and family in agreement.  I do not see any sign of infection.  And Ace wrap was slightly wrapped around bilateral breast today overpatient sports bra to help with compression.  Patient may use ice in the lateral aspect of the breast, avoid using directly over NAC or medial breast.

## 2021-06-21 ENCOUNTER — Other Ambulatory Visit: Payer: Self-pay

## 2021-06-21 ENCOUNTER — Encounter: Payer: Self-pay | Admitting: Plastic Surgery

## 2021-06-21 ENCOUNTER — Ambulatory Visit (INDEPENDENT_AMBULATORY_CARE_PROVIDER_SITE_OTHER): Payer: Medicaid Other | Admitting: Plastic Surgery

## 2021-06-21 DIAGNOSIS — N62 Hypertrophy of breast: Secondary | ICD-10-CM

## 2021-06-21 NOTE — Progress Notes (Signed)
   Subjective:    Patient ID: Erica Glenn, female    DOB: 22-Jan-1996, 25 y.o.   MRN: [id number]  The patient is a 25 year old female here for follow-up on her breast reduction.  She had over 1000 g removed from her right breast and 1300 from her left.  No sign of infection.  No signs of hematoma or seroma.  Her incisions are intact. Slightly more swelling on the left.  No sign of hematoma.     Review of Systems  Constitutional: Negative.   Eyes: Negative.   Respiratory: Negative.    Cardiovascular: Negative.   Gastrointestinal: Negative.   Endocrine: Negative.   Genitourinary: Negative.       Objective:   Physical Exam Nursing note reviewed.  Constitutional:      Appearance: Normal appearance.  Cardiovascular:     Rate and Rhythm: Normal rate.     Pulses: Normal pulses.  Pulmonary:     Effort: Pulmonary effort is normal.  Skin:    Capillary Refill: Capillary refill takes less than 2 seconds.  Neurological:     Mental Status: She is alert. Mental status is at baseline.       Assessment & Plan:     ICD-10-CM   1. Symptomatic mammary hypertrophy  N62       Continue with the sports bra.  I like to see her back in 3 to 4 weeks.  Increase activity slowly.  No heavy lifting.   Pictures were obtained of the patient and placed in the chart with the patient's or guardian's permission.

## 2021-07-01 ENCOUNTER — Telehealth: Payer: Self-pay

## 2021-07-01 ENCOUNTER — Other Ambulatory Visit: Payer: Self-pay

## 2021-07-01 ENCOUNTER — Ambulatory Visit (INDEPENDENT_AMBULATORY_CARE_PROVIDER_SITE_OTHER): Payer: Medicaid Other | Admitting: Surgical

## 2021-07-01 DIAGNOSIS — M546 Pain in thoracic spine: Secondary | ICD-10-CM

## 2021-07-01 DIAGNOSIS — N62 Hypertrophy of breast: Secondary | ICD-10-CM

## 2021-07-01 DIAGNOSIS — M542 Cervicalgia: Secondary | ICD-10-CM

## 2021-07-01 DIAGNOSIS — G8929 Other chronic pain: Secondary | ICD-10-CM

## 2021-07-01 NOTE — Telephone Encounter (Signed)
Patient called to say that she is experiencing swelling at the bottom of both of her breasts and she said that the left one is more swollen.  Patient said that she feels like she has to walk with her arms out to be comfortable.  Patient would like to be seen today if possible.  Please call.

## 2021-07-01 NOTE — Telephone Encounter (Signed)
Patient is coming in the office today

## 2021-07-01 NOTE — Progress Notes (Signed)
Patient is a 25 year old female here for follow-up after bilateral breast reduction with Dr. Ulice Bold on 06/01/2021.  She is 1 month postop.  She presents today for some concerns of increased swelling of her left breast that is causing some additional pain.  She also has some areas of tenderness specifically in the lateral aspect of each breast that is bothering her.  She is not having any infectious symptoms.  She is here with her mother.  On exam bilateral NAC's are viable, bilateral breast incisions are overall healing very nicely.  She does have 2 small wounds at the junction of the inframammary fold and vertical limb on each breast that are approximately 2 x 2 mm.  No cellulitic changes.  No foul odor is noted.  There is some increased swelling of the left breast that is noted with palpation and some possible subcutaneous fluid collection noted with palpation.  I was able to aspirate 40 cc of dark sanguinous fluid from the left breast using a sterile technique.  Patient tolerated this fine.  I recommend continue her compressive garments.  Recommend Vaseline and gauze to bilateral inframammary fold wounds daily.  I discussed with the patient and her mother that this would likely heal up in the next week or so.  Steri-Strips were removed.  I do not see any signs of infection.  Recommend calling with questions or concerns.  Patient has an appointment scheduled for 07/05/2021, discussed with patient we could certainly keep this appointment, however if her symptoms improve or she has no worsening changes we can schedule for 1 month from today.

## 2021-07-05 ENCOUNTER — Other Ambulatory Visit: Payer: Self-pay

## 2021-07-05 ENCOUNTER — Ambulatory Visit (INDEPENDENT_AMBULATORY_CARE_PROVIDER_SITE_OTHER): Payer: Medicaid Other | Admitting: Physician Assistant

## 2021-07-05 DIAGNOSIS — Z9889 Other specified postprocedural states: Secondary | ICD-10-CM

## 2021-07-05 NOTE — Progress Notes (Signed)
Patient is a 25 year old female who presents to the clinic for postop visit subsequent to bilateral breast reduction and liposuction performed 06/01/2021 by Dr. Ulice Bold.  Patient was recently seen here in clinic 07/01/2021 with concerns regarding increased swelling in her left breast and tenderness bilaterally.  Her physical exam was reassuring.  40 cc dark sanguinous fluid was aspirated from the left breast and patient tolerated procedure without difficulty.  Recommendations were made for Vaseline and gauze to bilateral inframammary fold wounds daily.  On my examination, patient is accompanied by her mother who was at bedside.  They are concerned about wounds under right breast that they feel might be worsening.  They wanted to ensure that it was not infected.  She denies any significant pain symptoms or any other new or worsening issues.  She also denies any fevers, chills, or other systemic symptoms.  The incisions are still intact.  There is swelling involving the inferiolateral aspect of left breast.  Findings suggestive of mild fat necrosis.  No significant fluid wave shift appreciated.  Discussed attempted aspiration in the event that it is seroma/hematoma, but patient would prefer conservative approach and further declines intervention at this time.  Right inframammary region is particularly moist, but partially attributable to Vaseline which she is applying regularly.  Recommending regular dressing changes.  Wounds do not appear to be infected.  There is no significant erythema or induration noted.  No fluctuance noted.  She denies itching and exam not concerning for intertrigo at this time.    She will continue with Vaseline and regular dressing changes until her appointment in 2 to 3 weeks.  She knows to call with any questions or concerns.

## 2021-07-14 ENCOUNTER — Ambulatory Visit: Payer: Medicaid Other | Admitting: Plastic Surgery

## 2021-07-27 ENCOUNTER — Other Ambulatory Visit: Payer: Self-pay

## 2021-07-27 ENCOUNTER — Encounter: Payer: Self-pay | Admitting: Surgical

## 2021-07-27 ENCOUNTER — Ambulatory Visit (INDEPENDENT_AMBULATORY_CARE_PROVIDER_SITE_OTHER): Payer: Medicaid Other | Admitting: Surgical

## 2021-07-27 DIAGNOSIS — Z9889 Other specified postprocedural states: Secondary | ICD-10-CM

## 2021-07-27 DIAGNOSIS — N62 Hypertrophy of breast: Secondary | ICD-10-CM

## 2021-07-27 NOTE — Progress Notes (Signed)
Athziry is a very pleasant 25 year old female here for follow-up after bilateral breast reduction.  She is here with her mother.  She reports that she is overall doing well, still can notice some swelling of the left breast.  She has some questions about restrictions.  Chaperone present on exam On exam bilateral NAC's are viable, bilateral breast incisions are intact.  No wounds are noted.  She has some swelling of the left breast that is still present, the skin is not taut and there is minimal tension on the incisions.  There is no erythema or cellulitic changes noted of either breast.  No large subcutaneous fluid collections noted with palpation.  Possible subcutaneous fluid collection noted in the left breast.  She does have some discoloration of the left lateral breast from the honeycomb dressing that was placed in surgery.  The areas where the honeycomb white foam was placed has some slight darkened pigment.  Recommend beginning to use scar cream over entire breast incisions.  Recommend lotion and scar cream over left lateral breast where the honeycomb dressing scarring is noted.  Overall everything is healing well, some possible fluid collection noted in the left breast, however suspect this will resolve with time and compression.  No restrictions at this time.  Recommend continue her compressive garment for another month during the day.  Recommend calling with questions or concerns, following up as needed.  Picture was taken and placed in the patient's chart with patient's permission.

## 2021-08-24 ENCOUNTER — Other Ambulatory Visit: Payer: Self-pay

## 2021-08-24 ENCOUNTER — Ambulatory Visit
Admission: RE | Admit: 2021-08-24 | Discharge: 2021-08-24 | Disposition: A | Discharge status: Home / Self Care | Payer: Medicaid Other | Source: Ambulatory Visit | Attending: Internal Medicine | Admitting: Internal Medicine

## 2021-08-24 VITALS — BP 139/88 | HR 87 | Temp 98.3°F | Resp 18 | Ht 60.0 in | Wt 171.0 lb

## 2021-08-24 DIAGNOSIS — J020 Streptococcal pharyngitis: Secondary | ICD-10-CM | POA: Diagnosis present

## 2021-08-24 LAB — POCT RAPID STREP A: Streptococcus, Group A Screen (Direct): POSITIVE — AB

## 2021-08-24 MED ORDER — AMOXICILLIN 500 MG PO CAPS: 500.0000 mg | ORAL_CAPSULE | Freq: Two times a day (BID) | ORAL | 0 refills | Status: AC

## 2021-08-24 NOTE — ED Provider Notes (Signed)
[facility]    CSN: [id number] Arrival date & time: 08/24/21  1546      History   Chief Complaint Chief Complaint  Patient presents with   Sore Throat    HPI Erica Glenn is a 25 y.o. female comes to the urgent care with 1 day history of sore throat.  Patient denies any nasal congestion, runny nose, fever or chills.  No sick contacts.  No shortness of breath or wheezing.  Patient has pain with swallowing.  She denies any vomiting or diarrhea.Marland Kitchen   HPI  Past Medical History:  Diagnosis Date   Asthma    Hx gestational diabetes    Seasonal allergies    Symptomatic mammary hypertrophy     Patient Active Problem List   Diagnosis Date Noted   S/P bilateral breast reduction 07/05/2021   Symptomatic mammary hypertrophy 11/30/2020   Back pain 11/30/2020   Neck pain 11/30/2020   Postpartum care following vaginal delivery 03/11/2016   Proteinuria affecting pregnancy in third trimester 02/24/2016   Asthma 02/24/2016    Past Surgical History:  Procedure Laterality Date   BREAST REDUCTION SURGERY Bilateral 06/01/2021   Procedure: BREAST REDUCTION WITH LIPOSUCTION;  Surgeon: Peggye Form, DO;  [facility]:  [facility];  Service: Plastics;  Laterality: Bilateral;  3 hours   CHOLECYSTECTOMY  2017    OB History     Gravida  2   Para  2   Term  2   Preterm      AB      Living  2      SAB      IAB      Ectopic      Multiple  0   Live Births  2            Home Medications    Prior to Admission medications   Medication Sig Start Date End Date Taking? Authorizing Provider  amoxicillin (AMOXIL) 500 MG capsule Take 1 capsule (500 mg total) by mouth 2 (two) times daily for 10 days. 08/24/21 09/03/21 Yes Jeffrey Graefe, Britta Mccreedy, MD  levonorgestrel (MIRENA) 20 MCG/24HR IUD 1 each by Intrauterine route once.   Yes [provider]  cetirizine (ZYRTEC) 10 MG tablet Take 10 mg by mouth daily. 08/23/20   [provider]   PROAIR HFA 108 (90 Base) MCG/ACT inhaler Inhale into the lungs. 07/23/20   [provider]    Family History Family History  Problem Relation Age of Onset   Healthy Mother    Cirrhosis Father    Alcohol abuse Father     Social History Social History   Tobacco Use   Smoking status: Never   Smokeless tobacco: Never  Vaping Use   Vaping Use: Never used  Substance Use Topics   Alcohol use: No   Drug use: No     Allergies   Patient has no known allergies.   Review of Systems Review of Systems  HENT:  Positive for sore throat. Negative for congestion, nosebleeds, postnasal drip and rhinorrhea.   Respiratory: Negative.    Cardiovascular: Negative.     Physical Exam Triage Vital Signs ED Triage Vitals  Enc Vitals Group     BP 08/24/21 1614 139/88     Pulse Rate 08/24/21 1614 87     Resp 08/24/21 1614 18     Temp 08/24/21 1614 98.3 F (36.8 C)     Temp Source 08/24/21 1614 Oral     SpO2 08/24/21  1614 97 %     Weight 08/24/21 1612 171 lb (77.6 kg)     Height 08/24/21 1612 5' (1.524 m)     Head Circumference --      Peak Flow --      Pain Score 08/24/21 1612 10     Pain Loc --      Pain Edu? --      Excl. in GC? --    No data found.  Updated Vital Signs BP 139/88 (BP Location: Left Arm)   Pulse 87   Temp 98.3 F (36.8 C) (Oral)   Resp 18   Ht 5' (1.524 m)   Wt 77.6 kg   SpO2 97%   BMI 33.40 kg/m   Visual Acuity Right Eye Distance:   Left Eye Distance:   Bilateral Distance:    Right Eye Near:   Left Eye Near:    Bilateral Near:     Physical Exam Vitals and nursing note reviewed.  Constitutional:      General: She is not in acute distress.    Appearance: She is not ill-appearing.  HENT:     Right Ear: Tympanic membrane normal.     Left Ear: Tympanic membrane normal.     Mouth/Throat:     Mouth: Mucous membranes are moist.     Pharynx: Posterior oropharyngeal erythema present.     Tonsils: Tonsillar exudate present. 2+ on the  right. 2+ on the left.  Cardiovascular:     Rate and Rhythm: Normal rate and regular rhythm.  Pulmonary:     Effort: Pulmonary effort is normal.     Breath sounds: Normal breath sounds.  Neurological:     Mental Status: She is alert.     UC Treatments / Results  Labs (all labs ordered are listed, but only abnormal results are displayed) Labs Reviewed  POCT RAPID STREP A - Abnormal; Notable for the following components:      Result Value   Streptococcus, Group A Screen (Direct) POSITIVE (*)    All other components within normal limits  POCT RAPID STREP A, ED / UC    EKG   Radiology No results found.  Procedures Procedures (including critical care time)  Medications Ordered in UC Medications - No data to display  Initial Impression / Assessment and Plan / UC Course  I have reviewed the triage vital signs and the nursing notes.  Pertinent labs & imaging results that were available during my care of the patient were reviewed by me and considered in my medical decision making (see chart for details).     1.  Streptococcal sore throat: Point-of-care strep test is positive Amoxicillin 500 mg twice daily for 10 days Warm salt water gargle Tylenol or Motrin as needed for pain and/or fever If symptoms worsen please return to urgent care to be reevaluated. Final Clinical Impressions(s) / UC Diagnoses   Final diagnoses:  Streptococcal sore throat     Discharge Instructions      Chloraseptic throat spray Cervical lozenges as needed for throat pain Complete the antibiotics as prescribed Return to urgent care if symptoms worsen.   ED Prescriptions     Medication Sig Dispense Auth. Provider   amoxicillin (AMOXIL) 500 MG capsule Take 1 capsule (500 mg total) by mouth 2 (two) times daily for 10 days. 20 capsule Dshawn Mcnay, Britta Mccreedy, MD      PDMP not reviewed this encounter.   Merrilee Jansky, MD 08/24/21 1710

## 2021-08-24 NOTE — ED Triage Notes (Signed)
Pt c/o sore throat since yesterday. Pt denies nasal congestion, cough, f/n/v/d or other symptoms.

## 2021-08-24 NOTE — Discharge Instructions (Addendum)
Chloraseptic throat spray Cervical lozenges as needed for throat pain Complete the antibiotics as prescribed Return to urgent care if symptoms worsen.

## 2021-12-29 ENCOUNTER — Other Ambulatory Visit: Payer: Self-pay

## 2022-07-02 ENCOUNTER — Ambulatory Visit
Admission: RE | Admit: 2022-07-02 | Discharge: 2022-07-02 | Disposition: A | Discharge status: Home / Self Care | Payer: Medicaid Other | Source: Ambulatory Visit | Attending: Emergency Medicine | Admitting: Emergency Medicine

## 2022-07-02 VITALS — BP 123/84 | HR 120 | Temp 99.2°F | Resp 18 | Ht 60.0 in | Wt 170.0 lb

## 2022-07-02 DIAGNOSIS — M545 Low back pain, unspecified: Secondary | ICD-10-CM

## 2022-07-02 LAB — URINALYSIS, ROUTINE W REFLEX MICROSCOPIC
Bilirubin Urine: NEGATIVE
Glucose, UA: NEGATIVE mg/dL
Ketones, ur: NEGATIVE mg/dL
Leukocytes,Ua: NEGATIVE
Nitrite: NEGATIVE
Specific Gravity, Urine: 1.02 (ref 1.005–1.030)
pH: 7 (ref 5.0–8.0)

## 2022-07-02 LAB — URINALYSIS, MICROSCOPIC (REFLEX): WBC, UA: NONE SEEN WBC/hpf (ref 0–5)

## 2022-07-02 MED ORDER — PREDNISONE 20 MG PO TABS: 40.0000 mg | ORAL_TABLET | Freq: Every day | ORAL | 0 refills | Status: DC

## 2022-07-02 MED ORDER — CYCLOBENZAPRINE HCL 10 MG PO TABS: 10.0000 mg | ORAL_TABLET | Freq: Every day | ORAL | 0 refills | Status: DC

## 2022-07-02 NOTE — ED Provider Notes (Signed)
[facility]    CSN: [id number] Arrival date & time: 07/02/22  1346      History   Chief Complaint Chief Complaint  Patient presents with   Back Pain   Abdominal Pain    HPI Erica Glenn is a 26 y.o. female.    Patient presents with generalized back pain for 6 days, right-sided low back pain for 4 days and urinary frequency for 2 days.  Endorses a recent 10-hour car drive to [place] which may have caused symptoms.  Has attempted use of Tylenol which has been minimally effective.  Range of motion of the back is intact.  Denies urgency, hematuria, dysuria, lower abdominal pain or pressure, fever, chills, vaginal discharge, itching or odor, urinary or bowel incontinence..  Denies concern for STI.  History of scoliosis.  Past Medical History:  Diagnosis Date   Asthma    Hx gestational diabetes    Seasonal allergies    Symptomatic mammary hypertrophy     Patient Active Problem List   Diagnosis Date Noted   S/P bilateral breast reduction 07/05/2021   Symptomatic mammary hypertrophy 11/30/2020   Back pain 11/30/2020   Neck pain 11/30/2020   Postpartum care following vaginal delivery 03/11/2016   Proteinuria affecting pregnancy in third trimester 02/24/2016   Asthma 02/24/2016    Past Surgical History:  Procedure Laterality Date   BREAST REDUCTION SURGERY Bilateral 06/01/2021   Procedure: BREAST REDUCTION WITH LIPOSUCTION;  Surgeon: Peggye Form, DO;  Location: [facility];  Service: Plastics;  Laterality: Bilateral;  3 hours   CHOLECYSTECTOMY  2017    OB History     Gravida  2   Para  2   Term  2   Preterm      AB      Living  2      SAB      IAB      Ectopic      Multiple  0   Live Births  2            Home Medications    Prior to Admission medications   Medication Sig Start Date End Date Taking? Authorizing Provider  cetirizine (ZYRTEC) 10 MG tablet Take 10 mg by mouth daily. 08/23/20  Yes [provider]  levonorgestrel (MIRENA) 20 MCG/24HR IUD 1 each by Intrauterine route once.   Yes [provider]  PROAIR HFA 108 [id number] Base) MCG/ACT inhaler Inhale into the lungs. 07/23/20  Yes [provider]    Family History Family History  Problem Relation Age of Onset   Healthy Mother    Cirrhosis Father    Alcohol abuse Father     Social History Social History   Tobacco Use   Smoking status: Never   Smokeless tobacco: Never  Vaping Use   Vaping Use: Never used  Substance Use Topics   Alcohol use: No   Drug use: No     Allergies   Patient has no known allergies.   Review of Systems Review of Systems  Constitutional: Negative.   Genitourinary:  Positive for frequency. Negative for decreased urine volume, difficulty urinating, dyspareunia, dysuria, enuresis, flank pain, genital sores, menstrual problem, pelvic pain, urgency, vaginal bleeding, vaginal discharge and vaginal pain.  Musculoskeletal:  Positive for back pain. Negative for arthralgias, gait problem, joint swelling, myalgias, neck pain and neck stiffness.  Skin: Negative.      Physical Exam Triage Vital Signs ED Triage Vitals  Enc Vitals Group  BP 07/02/22 1403 123/84     Pulse Rate 07/02/22 1403 (!) 120     Resp 07/02/22 1403 18     Temp 07/02/22 1403 99.2 F (37.3 C)     Temp Source 07/02/22 1403 Oral     SpO2 07/02/22 1403 95 %     Weight 07/02/22 1400 170 lb (77.1 kg)     Height 07/02/22 1400 5' (1.524 m)     Head Circumference --      Peak Flow --      Pain Score 07/02/22 1400 6     Pain Loc --      Pain Edu? --      Excl. in GC? --    No data found.  Updated Vital Signs BP 123/84 (BP Location: Left Arm)   Pulse (!) 120   Temp 99.2 F (37.3 C) (Oral)   Resp 18   Ht 5' (1.524 m)   Wt 170 lb (77.1 kg)   LMP 06/25/2022   SpO2 95%   BMI 33.20 kg/m   Visual Acuity Right Eye Distance:   Left Eye Distance:   Bilateral Distance:    Right Eye Near:   Left Eye  Near:    Bilateral Near:     Physical Exam Constitutional:      Appearance: Normal appearance.  HENT:     Head: Normocephalic.  Eyes:     Extraocular Movements: Extraocular movements intact.  Pulmonary:     Effort: Pulmonary effort is normal.  Abdominal:     General: Abdomen is flat. Bowel sounds are normal.     Palpations: Abdomen is soft.     Tenderness: There is abdominal tenderness in the suprapubic area.  Musculoskeletal:     Comments: Tenderness over the right lower lumbar region without ecchymosis, swelling or deformity, range of motion is intact, negative straight leg test  Skin:    General: Skin is warm and dry.  Neurological:     Mental Status: She is alert and oriented to person, place, and time. Mental status is at baseline.  Psychiatric:        Mood and Affect: Mood normal.        Behavior: Behavior normal.      UC Treatments / Results  Labs (all labs ordered are listed, but only abnormal results are displayed) Labs Reviewed  URINALYSIS, ROUTINE W REFLEX MICROSCOPIC    EKG   Radiology No results found.  Procedures Procedures (including critical care time)  Medications Ordered in UC Medications - No data to display  Initial Impression / Assessment and Plan / UC Course  I have reviewed the triage vital signs and the nursing notes.  Pertinent labs & imaging results that were available during my care of the patient were reviewed by me and considered in my medical decision making (see chart for details).  Acute right-sided low back pain without sciatica  Urinalysis is negative, discussed with patient, will treat for muscular pain, offered Toradol injection, declined, prescribed prednisone and Flexeril for outpatient management and recommended RICE for additional supportive measures, given walking referral to orthopedics if symptoms persist or worsen, may follow-up with urgent care as needed Final Clinical Impressions(s) / UC Diagnoses   Final  diagnoses:  None   Discharge Instructions   None    ED Prescriptions   None    PDMP not reviewed this encounter.   Valinda Hoar, [place] 07/04/22 [phone]

## 2022-07-02 NOTE — ED Triage Notes (Signed)
Pt c/o lower back pain on the right side.   Pt states that she is having urinary frequency and abdominal pain.

## 2022-07-02 NOTE — Discharge Instructions (Addendum)
Your pain is most likely caused by irritation to the muscles or ligaments.   You may use heating pad in 15 minute intervals as needed for additional comfort, or you may find comfort in using ice in 10-15 minutes over affected area  Urinalysis negative, you will be called if we see bacterial growth   Begin stretching affected area daily for 10 minutes as tolerated to further loosen muscles   When lying down place pillow underneath and between knees for support  Can try sleeping without pillow on firm mattress   Practice good posture: head back, shoulders back, chest forward, pelvis back and weight distributed evenly on both legs  If pain persist after recommended treatment or reoccurs if may be beneficial to follow up with orthopedic specialist for evaluation, this doctor specializes in the bones and can manage your symptoms long-term with options such as but not limited to imaging, medications or physical therapy

## 2022-07-04 LAB — URINE CULTURE

## 2022-10-27 ENCOUNTER — Emergency Department
Admission: EM | Admit: 2022-10-27 | Discharge: 2022-10-27 | Disposition: A | Discharge status: Home / Self Care | Payer: Medicaid Other | Attending: Emergency Medicine | Admitting: Emergency Medicine

## 2022-10-27 ENCOUNTER — Emergency Department: Payer: Medicaid Other

## 2022-10-27 ENCOUNTER — Other Ambulatory Visit: Payer: Self-pay

## 2022-10-27 ENCOUNTER — Encounter: Payer: Self-pay | Admitting: Emergency Medicine

## 2022-10-27 DIAGNOSIS — N939 Abnormal uterine and vaginal bleeding, unspecified: Secondary | ICD-10-CM | POA: Diagnosis not present

## 2022-10-27 DIAGNOSIS — R1032 Left lower quadrant pain: Secondary | ICD-10-CM | POA: Diagnosis not present

## 2022-10-27 LAB — URINALYSIS, ROUTINE W REFLEX MICROSCOPIC
RBC / HPF: >50 RBC/hpf — ABNORMAL HIGH (ref 0–5)
Specific Gravity, Urine: 1.024 (ref 1.005–1.030)
WBC, UA: >50 WBC/hpf — ABNORMAL HIGH (ref 0–5)

## 2022-10-27 LAB — POC URINE PREG, ED: Preg Test, Ur: NEGATIVE

## 2022-10-27 LAB — BASIC METABOLIC PANEL
Anion gap: 6 (ref 5–15)
BUN: 14 mg/dL (ref 6–20)
CO2: 26 mmol/L (ref 22–32)
Calcium: 9 mg/dL (ref 8.9–10.3)
Chloride: 107 mmol/L (ref 98–111)
Creatinine, Ser: 0.62 mg/dL (ref 0.44–1.00)
GFR, Estimated: >60 mL/min (ref >60)
Glucose, Bld: 106 mg/dL — ABNORMAL HIGH (ref 70–99)
Potassium: 3.6 mmol/L (ref 3.5–5.1)
Sodium: 139 mmol/L (ref 135–145)

## 2022-10-27 LAB — CBC
HCT: 42.6 % (ref 36.0–46.0)
Hemoglobin: 13.8 g/dL (ref 12.0–15.0)
MCH: 26.7 pg (ref 26.0–34.0)
MCHC: 32.4 g/dL (ref 30.0–36.0)
MCV: 82.6 fL (ref 80.0–100.0)
Platelets: 369 10*3/uL (ref 150–400)
RBC: 5.16 MIL/uL — ABNORMAL HIGH (ref 3.87–5.11)
RDW: 12.2 % (ref 11.5–15.5)
WBC: 11.6 10*3/uL — ABNORMAL HIGH (ref 4.0–10.5)
nRBC: 0 % (ref 0.0–0.2)

## 2022-10-27 LAB — HCG, QUANTITATIVE, PREGNANCY: hCG, Beta Chain, Quant, S: <1 m[IU]/mL (ref <5)

## 2022-10-27 NOTE — ED Provider Notes (Signed)
[facility] Provider Note    Event Date/Time   First MD Initiated Contact with Patient 10/27/22 [phone]     (approximate)   History   Abdominal Cramping   HPI  Erica Glenn is a 26 y.o. female with history of asthma and gestational diabetes presents emergency department with vaginal bleeding.  Patient has an IUD and had a faintly positive pregnancy test at home.  She is concerned that she could be pregnant.  She denies any severe abdominal pain.  States she has had her IUD for 6 years.      Physical Exam   Triage Vital Signs: ED Triage Vitals  Enc Vitals Group     BP 10/27/22 0852 (!) 152/90     Pulse Rate 10/27/22 0852 (!) 112     Resp 10/27/22 0852 16     Temp 10/27/22 0852 98.5 F (36.9 C)     Temp Source 10/27/22 0852 Oral     SpO2 10/27/22 0852 97 %     Weight 10/27/22 0853 169 lb 15.6 oz (77.1 kg)     Height 10/27/22 0853 5' (1.524 m)     Head Circumference --      Peak Flow --      Pain Score 10/27/22 0852 5     Pain Loc --      Pain Edu? --      Excl. in GC? --     Most recent vital signs: Vitals:   10/27/22 0852  BP: (!) 152/90  Pulse: (!) 112  Resp: 16  Temp: 98.5 F (36.9 C)  SpO2: 97%     General: Awake, no distress.   CV:  Good peripheral perfusion. regular rate and  rhythm Resp:  Normal effort.  Abd:  No distention.   Other:  Abdomen minimally tender in left lower quadrant, bowel sounds normal   ED Results / Procedures / Treatments   Labs (all labs ordered are listed, but only abnormal results are displayed) Labs Reviewed  CBC - Abnormal; Notable for the following components:      Result Value   WBC 11.6 (*)    RBC 5.16 (*)    All other components within normal limits  BASIC METABOLIC PANEL - Abnormal; Notable for the following components:   Glucose, Bld 106 (*)    All other components within normal limits  URINALYSIS, ROUTINE W REFLEX MICROSCOPIC - Abnormal; Notable for the following components:    Color, Urine RED (*)    APPearance CLOUDY (*)    Glucose, UA   (*)    Value: TEST NOT REPORTED DUE TO COLOR INTERFERENCE OF URINE PIGMENT   Hgb urine dipstick   (*)    Value: TEST NOT REPORTED DUE TO COLOR INTERFERENCE OF URINE PIGMENT   Bilirubin Urine   (*)    Value: TEST NOT REPORTED DUE TO COLOR INTERFERENCE OF URINE PIGMENT   Ketones, ur   (*)    Value: TEST NOT REPORTED DUE TO COLOR INTERFERENCE OF URINE PIGMENT   Protein, ur   (*)    Value: TEST NOT REPORTED DUE TO COLOR INTERFERENCE OF URINE PIGMENT   Nitrite   (*)    Value: TEST NOT REPORTED DUE TO COLOR INTERFERENCE OF URINE PIGMENT   Leukocytes,Ua   (*)    Value: TEST NOT REPORTED DUE TO COLOR INTERFERENCE OF URINE PIGMENT   RBC / HPF >50 (*)    WBC, UA >50 (*)    Bacteria, UA  RARE (*)    All other components within normal limits  HCG, QUANTITATIVE, PREGNANCY  POC URINE PREG, ED     EKG     RADIOLOGY X-ray of the abdomen 1 view    PROCEDURES:   Procedures   MEDICATIONS ORDERED IN ED: Medications - No data to display   IMPRESSION / MDM / [place] / ED COURSE  I reviewed the triage vital signs and the nursing notes.                              Differential diagnosis includes, but is not limited to, miscarriage, false positive pregnancy test, IUD displacement  Patient's presentation is most consistent with acute complicated illness / injury requiring diagnostic workup.   Patient's labs are all reassuring, they do not indicate that she is pregnant.  X-ray of the abdomen individually reviewed and interpreted by me as having positive IUD placement.  I did explain these findings to the patient.  Explained to her that she is not pregnant.  She is to follow-up with GYN for any problems concerning her IUD.  She is in agreement treatment plan.  Discharged stable condition.    FINAL CLINICAL IMPRESSION(S) / ED DIAGNOSES   Final diagnoses:  Vaginal bleeding     Rx / DC Orders   ED  Discharge Orders     None        Note:  This document was prepared using Dragon voice recognition software and may include unintentional dictation errors.    Versie Starks, PA-C 10/27/22 1038    Lavonia Drafts, MD 10/27/22 1140

## 2022-10-27 NOTE — Discharge Instructions (Signed)
Your IUD is still in the uterus, follow-up with GYN.  Return if worsening

## 2022-10-27 NOTE — ED Triage Notes (Addendum)
Pt here with abd cramping and bleeding . Pt states she had an IUD placed 6 years ago but started cramping and bleeding a week ago. Pt states a lot of blood but without clots. Pt denies N/V/D. Pt states she has took 2 positive pregnancy tests.

## 2023-05-11 ENCOUNTER — Other Ambulatory Visit: Payer: Self-pay | Admitting: Nurse Practitioner

## 2023-05-11 DIAGNOSIS — N939 Abnormal uterine and vaginal bleeding, unspecified: Secondary | ICD-10-CM

## 2023-05-21 ENCOUNTER — Ambulatory Visit
Admission: RE | Admit: 2023-05-21 | Discharge: 2023-05-21 | Disposition: A | Discharge status: Home / Self Care | Payer: Managed Care, Other (non HMO) | Source: Ambulatory Visit | Attending: Nurse Practitioner | Admitting: Nurse Practitioner

## 2023-05-21 DIAGNOSIS — N939 Abnormal uterine and vaginal bleeding, unspecified: Secondary | ICD-10-CM | POA: Diagnosis present

## 2023-05-24 ENCOUNTER — Encounter: Payer: Managed Care, Other (non HMO) | Admitting: Licensed Practical Nurse

## 2023-06-05 ENCOUNTER — Ambulatory Visit: Payer: Managed Care, Other (non HMO) | Admitting: Obstetrics

## 2023-06-05 ENCOUNTER — Encounter: Payer: Self-pay | Admitting: Obstetrics

## 2023-06-05 VITALS — BP 119/79 | HR 92 | Ht 60.0 in | Wt 171.0 lb

## 2023-06-05 DIAGNOSIS — Z3202 Encounter for pregnancy test, result negative: Secondary | ICD-10-CM | POA: Diagnosis not present

## 2023-06-05 DIAGNOSIS — Z30433 Encounter for removal and reinsertion of intrauterine contraceptive device: Secondary | ICD-10-CM

## 2023-06-05 MED ORDER — LEVONORGESTREL 20 MCG/DAY IU IUD
1.0000 | INTRAUTERINE_SYSTEM | Freq: Once | INTRAUTERINE | Status: AC
  Administered 2023-06-05: 1 via INTRAUTERINE

## 2023-06-05 NOTE — Progress Notes (Signed)
   ENCOUNTER FOR IUD REMOVAL AND REPLACEMENT   Subjective  Erica Glenn is a 27 y.o. [id number] who presents today for IUD removal and replacement. She desires reversible long-term contraception. She has had her IUD for approximately 7 years and has had heavy bleeding since insertion. [place] on 05/21/23 shows IUD in the lower uterine segment/endocervical canal. We have thoroughly reviewed the risks, benefits, and alternatives, and she has elected to proceed with removal of the malpositioned Mirena and insertion of a new device.   Objective BP 128/80   Pulse 93   Wt 175 lb (79.4 kg)   LMP 11/11/2022 (Exact Date)   BMI 32.00 kg/m   Pelvic exam: normal external genitalia, vulva, vagina, cervix, uterus and adnexa. IUD strings visible.  Procedure Note Consent was obtained prior to the procedure. A bimanual exam was performed to determine the position of the uterus. A sterile speculum was placed in the vagina, and the cervix was visualized. The IUD strings were grasped with a ring forceps and removed with gentle traction. Betadine was applied to the cervix followed by hurricane spray. The uterus was sounded to about 9 cm. The IUD was inserted to the appropriate depth and the insertion tool was removed. The strings were trimmed to about 3 cm. Bleeding was minimal. [name] tolerated the procedure well. Post-procedure care and warning signs were reviewed with [facility]. If her bleeding does not significantly improve in 3-6 months, recommend repeating US for placement.  Follow up for annual visit or PRN.  Guadlupe Spanish, CNM

## 2023-06-06 LAB — POCT URINE PREGNANCY: Preg Test, Ur: NEGATIVE

## 2023-06-06 NOTE — Addendum Note (Signed)
Addended by: Loney Laurence on: 06/06/2023 10:01 AM   Modules accepted: Orders

## 2024-01-07 ENCOUNTER — Encounter: Payer: Self-pay | Admitting: Obstetrics

## 2024-01-07 ENCOUNTER — Other Ambulatory Visit (HOSPITAL_COMMUNITY)
Admission: RE | Admit: 2024-01-07 | Discharge: 2024-01-07 | Disposition: A | Discharge status: Home / Self Care | Payer: Managed Care, Other (non HMO) | Source: Ambulatory Visit | Attending: Obstetrics | Admitting: Obstetrics

## 2024-01-07 ENCOUNTER — Ambulatory Visit (INDEPENDENT_AMBULATORY_CARE_PROVIDER_SITE_OTHER): Payer: Managed Care, Other (non HMO) | Admitting: Obstetrics

## 2024-01-07 VITALS — BP 125/87 | HR 112 | Ht 60.0 in | Wt 170.0 lb

## 2024-01-07 DIAGNOSIS — N939 Abnormal uterine and vaginal bleeding, unspecified: Secondary | ICD-10-CM | POA: Diagnosis not present

## 2024-01-07 DIAGNOSIS — N898 Other specified noninflammatory disorders of vagina: Secondary | ICD-10-CM | POA: Diagnosis present

## 2024-01-07 DIAGNOSIS — Z975 Presence of (intrauterine) contraceptive device: Secondary | ICD-10-CM | POA: Diagnosis not present

## 2024-01-07 DIAGNOSIS — Z30431 Encounter for routine checking of intrauterine contraceptive device: Secondary | ICD-10-CM

## 2024-01-07 DIAGNOSIS — Z8742 Personal history of other diseases of the female genital tract: Secondary | ICD-10-CM | POA: Diagnosis not present

## 2024-01-07 NOTE — Progress Notes (Signed)
   GYN ENCOUNTER  Subjective  HPI: Erica Glenn is a 28 y.o. [phone] who presents today for abnormal bleeding with an IUD in place. Her IUD was placed 06/05/23. Her prior IUD had become displaced and was causing heavy bleeding. She reports that she typically has heavy bleeding even when the IUD is in place. She has used COCs in the past which improved her bleeding, but she got pregnant twice while using them. She strongly prefers a more reliable contraceptive option. She reports that since November, she has been having a weeklong period each month with daily spotting in between periods. She notices an increase in bleeding with intercourse.   Past Medical History:  Diagnosis Date   Asthma    Hx gestational diabetes    Seasonal allergies    Symptomatic mammary hypertrophy    Past Surgical History:  Procedure Laterality Date   BREAST REDUCTION SURGERY Bilateral 06/01/2021   Procedure: BREAST REDUCTION WITH LIPOSUCTION;  Surgeon: Thornell Flirt, DO;  Location: [facility];  Service: Plastics;  Laterality: Bilateral;  3 hours   CHOLECYSTECTOMY  2017   OB History     Gravida  2   Para  2   Term  2   Preterm      AB      Living  2      SAB      IAB      Ectopic      Multiple  0   Live Births  2          No Known Allergies  HPI: Declines  Objective  BP 125/87   Pulse (!) 112   Ht 5' (1.524 m)   Wt 170 lb (77.1 kg)   BMI 33.20 kg/m   Physical examination Declines exam  Assessment  -Heavy bleeding with IUD in place -Vaginal odor  Plan  -Discussed possible causes of heavy bleeding, including IUD migration and infection, and options for treatment -Pelvic US  ordered -Vaginal swab collected -Will try 30-month course of COCs to regulate bleeding. Samples of Lo Lo Estrin given. Discussed proper administration and danger signs. -F/u in 3 months or PRN  Josue Nip, CNM

## 2024-01-09 ENCOUNTER — Other Ambulatory Visit: Payer: Self-pay | Admitting: Obstetrics

## 2024-01-09 ENCOUNTER — Encounter: Payer: Self-pay | Admitting: Obstetrics

## 2024-01-09 LAB — CERVICOVAGINAL ANCILLARY ONLY
Bacterial Vaginitis (gardnerella): POSITIVE — AB
Candida Glabrata: NEGATIVE
Candida Vaginitis: NEGATIVE
Chlamydia: NEGATIVE
Comment: NEGATIVE
Comment: NEGATIVE
Comment: NEGATIVE
Comment: NEGATIVE
Comment: NEGATIVE
Comment: NORMAL
Neisseria Gonorrhea: NEGATIVE
Trichomonas: NEGATIVE

## 2024-01-09 MED ORDER — METRONIDAZOLE 500 MG PO TABS: 500.0000 mg | ORAL_TABLET | Freq: Two times a day (BID) | ORAL | 0 refills | Status: DC

## 2024-01-09 NOTE — Progress Notes (Signed)
+  BV. Rx for metronidazole 500 mg PO BID x 7 days sent to pharmacy. Haileigh notified via MyChart.  M. Chryl Heck, CNM

## 2024-01-11 ENCOUNTER — Other Ambulatory Visit: Payer: Managed Care, Other (non HMO)

## 2024-01-11 ENCOUNTER — Ambulatory Visit
Admission: RE | Admit: 2024-01-11 | Discharge: 2024-01-11 | Disposition: A | Discharge status: Home / Self Care | Payer: Managed Care, Other (non HMO) | Source: Ambulatory Visit | Attending: Obstetrics | Admitting: Obstetrics

## 2024-01-11 DIAGNOSIS — Z8742 Personal history of other diseases of the female genital tract: Secondary | ICD-10-CM | POA: Diagnosis present

## 2024-01-11 DIAGNOSIS — Z30431 Encounter for routine checking of intrauterine contraceptive device: Secondary | ICD-10-CM | POA: Insufficient documentation

## 2024-01-12 ENCOUNTER — Encounter: Payer: Self-pay | Admitting: Obstetrics

## 2024-02-15 ENCOUNTER — Other Ambulatory Visit: Payer: Self-pay | Admitting: Physician Assistant

## 2024-02-15 DIAGNOSIS — R519 Headache, unspecified: Secondary | ICD-10-CM

## 2024-02-15 DIAGNOSIS — G4483 Primary cough headache: Secondary | ICD-10-CM

## 2024-03-03 ENCOUNTER — Ambulatory Visit
Admission: RE | Admit: 2024-03-03 | Discharge: 2024-03-03 | Disposition: A | Discharge status: Home / Self Care | Source: Ambulatory Visit | Attending: Physician Assistant | Admitting: Physician Assistant

## 2024-03-03 DIAGNOSIS — R519 Headache, unspecified: Secondary | ICD-10-CM

## 2024-03-03 DIAGNOSIS — G4483 Primary cough headache: Secondary | ICD-10-CM

## 2024-03-03 MED ORDER — GADOPICLENOL 0.5 MMOL/ML IV SOLN
7.5000 mL | Freq: Once | INTRAVENOUS | Status: AC | PRN
  Administered 2024-03-03: 7.5 mL via INTRAVENOUS

## 2024-03-26 ENCOUNTER — Telehealth: Payer: Self-pay

## 2024-03-26 NOTE — Telephone Encounter (Signed)
 Received call from patient requesting follow-up appointment for heavy vaginal bleeding with a Mirena  IUD.  She says she has been bleeding nonstop for 6 weeks.   She says she did not take the COCs prescribed for bleeding at her last appointment because she thought the bleeding was due to her IUD being in the wrong location.  Advised patient should start taking the COCs as they may still help with the bleeding.  Scheduled for appointment next week for evaluation and possible IUD removal.  She would like another placed if possible.  Harley Lies. RN

## 2024-04-07 ENCOUNTER — Ambulatory Visit: Admitting: Obstetrics

## 2024-05-09 ENCOUNTER — Encounter: Payer: Self-pay | Admitting: Obstetrics

## 2024-05-09 ENCOUNTER — Ambulatory Visit (INDEPENDENT_AMBULATORY_CARE_PROVIDER_SITE_OTHER): Admitting: Obstetrics

## 2024-05-09 VITALS — BP 123/81 | HR 71 | Ht 59.0 in | Wt 166.7 lb

## 2024-05-09 DIAGNOSIS — Z30432 Encounter for removal of intrauterine contraceptive device: Secondary | ICD-10-CM | POA: Diagnosis not present

## 2024-05-09 MED ORDER — LORAZEPAM 1 MG PO TABS: 1.0000 mg | ORAL_TABLET | Freq: Once | ORAL | 0 refills | Status: AC | PRN

## 2024-05-09 NOTE — Progress Notes (Signed)
   GYN ENCOUNTER  Encounter for IUD removal  Subjective  HPI: Erica Glenn is a 28 y.o. [id number] who presents today for IUD removal. Her Mirena  IUD was placed in July 2024. She began having painful cramping and heavier  bleeding, and US  showed that her IUD was low in the endocervix. She would like to have her IUD replaced, but she prefers to have that done next week.  Past Medical History:  Diagnosis Date   Asthma    Hx gestational diabetes    Seasonal allergies    Symptomatic mammary hypertrophy    Past Surgical History:  Procedure Laterality Date   BREAST REDUCTION SURGERY Bilateral 06/01/2021   Procedure: BREAST REDUCTION WITH LIPOSUCTION;  Surgeon: Thornell Flirt, DO;  Location: [facility];  Service: Plastics;  Laterality: Bilateral;  3 hours   CHOLECYSTECTOMY  2017   OB History     Gravida  2   Para  2   Term  2   Preterm      AB      Living  2      SAB      IAB      Ectopic      Multiple  0   Live Births  2          No Known Allergies  ROS: See HPI  Objective  BP 123/81   Pulse 71   Ht 4' 11 (1.499 m)   Wt 166 lb 11.2 oz (75.6 kg)   LMP  (Approximate)   BMI 33.67 kg/m   Physical examination   Pelvic:   Vulva: Normal appearance.  No lesions.  Vagina: No lesions or abnormalities noted.  Support: Normal pelvic support.  Urethra No masses tenderness or scarring.  Meatus Normal size without lesions or prolapse.  Cervix: Normal appearance.  No lesions. IUD strings visible.  Perineum: Normal exam.  No lesions.   Cervix visualized with speculum. IUD strings visible at os. Strings grasped with ring forceps and intact IUD easily removed with gentle traction. There was minimal bleeding. Erica Glenn tolerated this well.  Assessment -IUD removed d/t malpositioning  Plan -Post-procedure care reviewed -Appt scheduled for IUD placement next week. Rx for pre-procedure lorazepam sent to pharmacy. -Instructed to use a backup  method of contraception if sexually active before IUD placement  Josue Nip, CNM

## 2024-05-15 ENCOUNTER — Ambulatory Visit (INDEPENDENT_AMBULATORY_CARE_PROVIDER_SITE_OTHER): Admitting: Obstetrics

## 2024-05-15 VITALS — BP 119/81 | HR 76 | Ht 59.0 in | Wt 168.6 lb

## 2024-05-15 DIAGNOSIS — Z3202 Encounter for pregnancy test, result negative: Secondary | ICD-10-CM | POA: Diagnosis not present

## 2024-05-15 DIAGNOSIS — Z30430 Encounter for insertion of intrauterine contraceptive device: Secondary | ICD-10-CM | POA: Diagnosis not present

## 2024-05-15 DIAGNOSIS — Z30431 Encounter for routine checking of intrauterine contraceptive device: Secondary | ICD-10-CM

## 2024-05-15 LAB — POCT URINE PREGNANCY: Preg Test, Ur: NEGATIVE

## 2024-05-15 MED ORDER — LEVONORGESTREL 20 MCG/DAY IU IUD
1.0000 | INTRAUTERINE_SYSTEM | Freq: Once | INTRAUTERINE | Status: AC
Start: 2024-05-15 — End: 2024-05-15
  Administered 2024-05-15: 1 via INTRAUTERINE

## 2024-05-15 NOTE — Progress Notes (Signed)
   ENCOUNTER FOR IUD INSERTION   Subjective  Erica Glenn is a 29 y.o. [id number] who presents today for IUD insertion. She desires reversible long-term contraception. We have thoroughly reviewed the risks, benefits, and alternatives, and she has elected to proceed with Mirena  insertion.   Objective BP 119/81 (BP Location: Right Arm, Patient Position: Sitting, Cuff Size: Normal)   Pulse 76   Ht 4' 11 (1.499 m)   Wt 168 lb 9.6 oz (76.5 kg)   LMP 05/09/2024 (Approximate)   BMI 34.05 kg/m   UPT: negative  Pelvic exam: normal external genitalia, vulva, vagina, cervix, uterus and adnexa. Mild cervical prolapse.  Procedure Note Consent was obtained prior to the procedure. A bimanual exam was performed to determine the position of the uterus. A sterile speculum was placed in the vagina, and the cervix was visualized. Betadine was applied to the cervix followed by 4% lidocaine . The uterus was sounded to about 9 cm. The IUD was inserted to the appropriate depth and the insertion tool was removed. The strings were trimmed to about 3 cm. Bleeding was minimal. [name] tolerated the procedure well. Post-procedure care and warning signs were reviewed with [name]. Will schedule pelvic US  for positioning d/t migration of IUD x 2.   Follow up for annual visit or PRN.  Armel Rabbani, CNM

## 2024-05-21 ENCOUNTER — Telehealth: Payer: Self-pay

## 2024-05-21 NOTE — Telephone Encounter (Signed)
 Pt called triage complaining of on/off right side pelvic pain since IUD insertion. Pain can be an 8 (from 1-10 with 10 being the worse). Has been taking ibuprofen  1-2 times a day, not helping much. Has also used a heating pad which helps some. She says she has an US  on Friday to check on IUD. Advised msg would be sent to Missy[facility] CMA.

## 2024-05-21 NOTE — Telephone Encounter (Signed)
 Advised patient to monitor for worsening symptoms including fever, heavy bleeding, or unusual discharge, and to seek immediate care if these occur.Recommended alternating ibuprofen  and acetaminophen  for improved pain management, if no contraindications.Advised pelvic rest (no intercourse or tampon use) until further evaluation,declined apt a the moment as she is already ha an appointment schedule.Patient verbalized understanding and is agreeable to plan.Encouraged to go to ED if SX get worse.

## 2024-05-23 ENCOUNTER — Ambulatory Visit

## 2024-05-23 ENCOUNTER — Other Ambulatory Visit: Payer: Self-pay | Admitting: Obstetrics

## 2024-05-23 DIAGNOSIS — Z30431 Encounter for routine checking of intrauterine contraceptive device: Secondary | ICD-10-CM | POA: Diagnosis not present

## 2024-05-23 DIAGNOSIS — Z30430 Encounter for insertion of intrauterine contraceptive device: Secondary | ICD-10-CM

## 2024-05-31 ENCOUNTER — Ambulatory Visit: Payer: Self-pay | Admitting: Obstetrics
